# Patient Record
Sex: Female | Born: 1940 | Race: White | Hispanic: No | Marital: Married
Health system: Southern US, Community
[De-identification: ages and names within clinical notes are randomized; demographics above are authoritative.]

---

## 1998-03-18 ENCOUNTER — Emergency Department (HOSPITAL_COMMUNITY): Admission: EM | Admit: 1998-03-18 | Discharge: 1998-03-18 | Payer: Self-pay | Admitting: Emergency Medicine

## 2004-04-14 ENCOUNTER — Emergency Department (HOSPITAL_COMMUNITY): Admission: EM | Admit: 2004-04-14 | Discharge: 2004-04-14 | Payer: Self-pay | Admitting: Family Medicine

## 2004-04-22 ENCOUNTER — Emergency Department (HOSPITAL_COMMUNITY): Admission: EM | Admit: 2004-04-22 | Discharge: 2004-04-22 | Payer: Self-pay | Admitting: Family Medicine

## 2006-06-14 ENCOUNTER — Emergency Department (HOSPITAL_COMMUNITY): Admission: EM | Admit: 2006-06-14 | Discharge: 2006-06-14 | Payer: Self-pay | Admitting: Family Medicine

## 2009-04-13 ENCOUNTER — Encounter: Admission: RE | Admit: 2009-04-13 | Discharge: 2009-04-13 | Payer: Self-pay | Admitting: Internal Medicine

## 2009-07-02 ENCOUNTER — Inpatient Hospital Stay (HOSPITAL_COMMUNITY): Admission: EM | Admit: 2009-07-02 | Discharge: 2009-07-07 | Payer: Self-pay | Admitting: Emergency Medicine

## 2009-07-05 ENCOUNTER — Encounter (INDEPENDENT_AMBULATORY_CARE_PROVIDER_SITE_OTHER): Payer: Self-pay | Admitting: Internal Medicine

## 2009-07-07 ENCOUNTER — Ambulatory Visit: Payer: Self-pay | Admitting: Internal Medicine

## 2009-08-04 ENCOUNTER — Encounter: Payer: Self-pay | Admitting: Emergency Medicine

## 2009-08-18 ENCOUNTER — Encounter: Payer: Self-pay | Admitting: Emergency Medicine

## 2009-08-19 ENCOUNTER — Telehealth (INDEPENDENT_AMBULATORY_CARE_PROVIDER_SITE_OTHER): Payer: Self-pay | Admitting: *Deleted

## 2009-09-01 ENCOUNTER — Ambulatory Visit: Payer: Self-pay | Admitting: Emergency Medicine

## 2009-09-01 DIAGNOSIS — I10 Essential (primary) hypertension: Secondary | ICD-10-CM | POA: Insufficient documentation

## 2009-09-01 DIAGNOSIS — J309 Allergic rhinitis, unspecified: Secondary | ICD-10-CM | POA: Insufficient documentation

## 2009-09-01 DIAGNOSIS — C9590 Leukemia, unspecified not having achieved remission: Secondary | ICD-10-CM | POA: Insufficient documentation

## 2009-09-01 DIAGNOSIS — J438 Other emphysema: Secondary | ICD-10-CM | POA: Insufficient documentation

## 2009-09-01 DIAGNOSIS — E119 Type 2 diabetes mellitus without complications: Secondary | ICD-10-CM

## 2009-09-01 DIAGNOSIS — R930 Abnormal findings on diagnostic imaging of skull and head, not elsewhere classified: Secondary | ICD-10-CM | POA: Insufficient documentation

## 2009-10-27 ENCOUNTER — Ambulatory Visit: Payer: Self-pay | Admitting: Hematology & Oncology

## 2009-10-28 ENCOUNTER — Inpatient Hospital Stay (HOSPITAL_COMMUNITY): Admission: EM | Admit: 2009-10-28 | Discharge: 2009-11-08 | Payer: Self-pay | Admitting: Emergency Medicine

## 2009-10-28 ENCOUNTER — Ambulatory Visit: Payer: Self-pay | Admitting: Internal Medicine

## 2009-10-29 ENCOUNTER — Ambulatory Visit: Payer: Self-pay | Admitting: Internal Medicine

## 2009-10-30 ENCOUNTER — Ambulatory Visit: Payer: Self-pay | Admitting: Hematology & Oncology

## 2009-10-31 ENCOUNTER — Ambulatory Visit: Payer: Self-pay | Admitting: Internal Medicine

## 2009-11-16 ENCOUNTER — Encounter: Payer: Self-pay | Admitting: Emergency Medicine

## 2009-11-16 LAB — COMPREHENSIVE METABOLIC PANEL
ALT: 43 U/L — ABNORMAL HIGH (ref 0–35)
CO2: 25 mEq/L (ref 19–32)
Calcium: 9.6 mg/dL (ref 8.4–10.5)
Chloride: 98 mEq/L (ref 96–112)
Creatinine, Ser: 1.2 mg/dL (ref 0.40–1.20)
Sodium: 136 mEq/L (ref 135–145)
Total Protein: 7.1 g/dL (ref 6.0–8.3)

## 2009-11-16 LAB — CBC WITH DIFFERENTIAL/PLATELET
BASO%: 0 % (ref 0.0–2.0)
EOS%: 0 % (ref 0.0–7.0)
HCT: 31.8 % — ABNORMAL LOW (ref 34.8–46.6)
LYMPH%: 3.5 % — ABNORMAL LOW (ref 14.0–49.7)
MCV: 90.2 fL (ref 79.5–101.0)
MONO#: 0.9 10*3/uL (ref 0.1–0.9)
NEUT#: 5.6 10*3/uL (ref 1.5–6.5)
NEUT%: 83.2 % — ABNORMAL HIGH (ref 38.4–76.8)
Platelets: 205 10*3/uL (ref 145–400)
WBC: 6.7 10*3/uL (ref 3.9–10.3)
lymph#: 0.2 10*3/uL — ABNORMAL LOW (ref 0.9–3.3)

## 2009-11-16 LAB — LACTATE DEHYDROGENASE: LDH: 495 U/L — ABNORMAL HIGH (ref 94–250)

## 2009-12-09 ENCOUNTER — Ambulatory Visit: Payer: Self-pay | Admitting: Internal Medicine

## 2009-12-13 ENCOUNTER — Inpatient Hospital Stay (HOSPITAL_COMMUNITY): Admission: EM | Admit: 2009-12-13 | Discharge: 2009-12-15 | Payer: Self-pay | Admitting: Emergency Medicine

## 2009-12-14 ENCOUNTER — Ambulatory Visit: Payer: Self-pay | Admitting: Internal Medicine

## 2009-12-17 ENCOUNTER — Encounter (HOSPITAL_COMMUNITY)
Admission: RE | Admit: 2009-12-17 | Discharge: 2010-03-17 | Payer: Self-pay | Source: Home / Self Care | Attending: Hematology & Oncology | Admitting: Hematology & Oncology

## 2010-04-26 NOTE — Letter (Signed)
Summary: [facility]   Imported By: Sherian Rein 09/06/2009 10:01:49  _____________________________________________________________________  External Attachment:    Type:   Image     Comment:   External Document

## 2010-04-26 NOTE — Letter (Signed)
Summary: [facility]  [facility]   Imported By: Sherian Rein 12/16/2009 14:35:05  _____________________________________________________________________  External Attachment:    Type:   Image     Comment:   External Document

## 2010-04-26 NOTE — Progress Notes (Signed)
  Phone Note Other Incoming   Request: Send information Summary of Call: Records received from [facility]. Patient has an upcoming appointment with Dr. Delton Coombes on 09/01/2009 @ 11:00. 12 pages forwarded to Dr. Delton Coombes for review.

## 2010-04-26 NOTE — Assessment & Plan Note (Signed)
Summary: COPD, AML   Visit Type:  Initial Consult Copy to:  Willette Pa at [facility] Provider/Referring Provider:  Dr. Tomi Bamberger in [place]  CC:  Pulmonary consult to evaluate for SOB. The patient c/o increased sob with exertion. Recently diagnosed with leukemia. Admitted to [facility] in April 2011 and was started on continuous oxygen at that time.Marland Kitchen  History of Present Illness: 70 yo woman, former tobacco, dx with COPD in 1/11 and started on nebs. Then admitted to [facility] 4/11 for possible AE COPD, unfortunately dx with AML. Sent to [facility] to initiate therapy, chemo. D/c on Spiriva, Symbicort + O2. Of note, had nodules and a LUL spiculated lesion on CT scan 08/04/09.   Preventive Screening-Counseling & Management  Alcohol-Tobacco     Alcohol drinks/day: 0     Smoking Status: quit     Year Quit: 2011     Pack years: 35 years x1 ppd  Current Medications (verified): 1)  Prednisone 10 Mg Tabs (Prednisone) .Marland Kitchen.. 1 By Mouth Daily 2)  Albuterol Sulfate (2.5 Mg/60ml) 0.083% Nebu (Albuterol Sulfate) .Marland Kitchen.. 1 Vial in Nebulizer Four Times Daily As Needed 3)  Spiriva Handihaler 18 Mcg Caps (Tiotropium Bromide Monohydrate) .... Once Daily 4)  Symbicort 160-4.5 Mcg/act Aero (Budesonide-Formoterol Fumarate) .... 2 Puffs Two Times A Day 5)  Combivent 18-103 Mcg/act Aero (Ipratropium-Albuterol) .... 2 Puffs Two Times A Day 6)  Klonopin 0.5 Mg Tabs (Clonazepam) .Marland Kitchen.. 1 By Mouth Two Times A Day 7)  Claritin 10 Mg Tabs (Loratadine) .... Once Daily 8)  Prozac 20 Mg Caps (Fluoxetine Hcl) .... Once Daily 9)  Protonix 40 Mg Tbec (Pantoprazole Sodium) .Marland Kitchen.. 1 By Mouth Daily 10)  Hydrochlorothiazide 25 Mg Tabs (Hydrochlorothiazide) .... Once Daily 11)  Metformin Hcl 500 Mg Tabs (Metformin Hcl) .Marland Kitchen.. 1 By Mouth Two Times A Day 12)  Lantus 100 Unit/ml Soln (Insulin Glargine) .... As Directed 13)  Humulin N 100 Unit/ml Susp (Insulin Isophane Human) .... Sliding Scale 14)  Restoril 15 Mg Caps (Temazepam) .Marland Kitchen.. 1  By Mouth At Bedtime As Needed  Allergies (verified): 1)  ! * Codeine  Past History:  Past Medical History: Current Problems:  LEUKEMIA (ICD-208.90) EMPHYSEMA (ICD-492.8) ALLERGIC RHINITIS (ICD-477.9)  Past Surgical History: Cholecystectomy in 1967  Family History: Family History Emphysema ---father Family History Asthma---son  Social History: Patient states former smoker.  Housewife MarriedSmoking Status:  quit Pack years:  35 years x1 ppd Alcohol drinks/day:  0  Vital Signs:  Patient profile:   70 year old female Height:      63 inches (160.02 cm) Weight:      189 pounds (85.91 kg) BMI:     33.60 O2 Sat:      62 % on 3 L/min Temp:     97.6 degrees F (36.44 degrees C) oral Pulse rate:   131 / minute BP sitting:   114 / 78  (left arm) Cuff size:   regular  Vitals Entered By: Michel Bickers CMA (September 01, 2009 11:30 AM)  O2 Sat at Rest %:  62 O2 Flow:  3 L/min  O2 Sat Comments After resting 5 minutes the patients sats came up to 94% on 3 liters cont oxygen. Pulse was 116.Michel Bickers CMA  September 01, 2009 11:32 AM CC: Pulmonary consult to evaluate for SOB. The patient c/o increased sob with exertion. Recently diagnosed with leukemia. Admitted to [facility] in April 2011 and was started on continuous oxygen at that time. Is Patient Diabetic? Yes   Physical  Exam  General:  normal appearance, healthy appearing, on O2, and obese.   Head:  normocephalic and atraumatic Eyes:  conjunctiva and sclera clear Nose:  no deformity, discharge, inflammation, or lesions Mouth:  no deformity or lesions Lungs:  very distant, no wheezes or crackles Heart:  regular rate and rhythm, S1, S2 without murmurs, rubs, gallops, or clicks Abdomen:  not examined Extremities:  1+ edema Neurologic:  non-focal Skin:  intact without lesions or rashes Psych:  alert and cooperative; normal mood and affect; normal attention span and concentration   Impression & Recommendations:  Problem # 1:   EMPHYSEMA (ICD-492.8) - agree with current BD's - O2, need to uptitrate with exertion to 5L/min - PFTs after chemo completed Orders: Consultation Level IV (16109) DME Referral (DME)  Problem # 2:  LEUKEMIA (ICD-208.90) For 2nd chemo next week  Problem # 3:  COMPUTERIZED TOMOGRAPHY, CHEST, ABNORMAL (ICD-793.1) Need repeat CT scan in August to follow LUL lesion; may decide to do sooner while admitted by oncology, will defer to their plans.   Medications Added to Medication List This Visit: 1)  Prednisone 10 Mg Tabs (Prednisone) .Marland Kitchen.. 1 by mouth daily 2)  Albuterol Sulfate (2.5 Mg/38ml) 0.083% Nebu (Albuterol sulfate) .Marland Kitchen.. 1 vial in nebulizer four times daily as needed 3)  Spiriva Handihaler 18 Mcg Caps (Tiotropium bromide monohydrate) .... Once daily 4)  Symbicort 160-4.5 Mcg/act Aero (Budesonide-formoterol fumarate) .... 2 puffs two times a day 5)  Combivent 18-103 Mcg/act Aero (Ipratropium-albuterol) .... 2 puffs two times a day 6)  Klonopin 0.5 Mg Tabs (Clonazepam) .Marland Kitchen.. 1 by mouth two times a day 7)  Claritin 10 Mg Tabs (Loratadine) .... Once daily 8)  Prozac 20 Mg Caps (Fluoxetine hcl) .... Once daily 9)  Protonix 40 Mg Tbec (Pantoprazole sodium) .Marland Kitchen.. 1 by mouth daily 10)  Hydrochlorothiazide 25 Mg Tabs (Hydrochlorothiazide) .... Once daily 11)  Metformin Hcl 500 Mg Tabs (Metformin hcl) .Marland Kitchen.. 1 by mouth two times a day 12)  Lantus 100 Unit/ml Soln (Insulin glargine) .... As directed 13)  Humulin N 100 Unit/ml Susp (Insulin isophane human) .... Sliding scale 14)  Restoril 15 Mg Caps (Temazepam) .Marland Kitchen.. 1 by mouth at bedtime as needed  Patient Instructions: 1)  Please continue your Spiriva and Symbicort 2)  Use oxygen at all times, 2L/min at rest and up to 5L/min with exertion.  3)  You need a repeat CT scan of the chest in August 2011. Depending on your findings during your hospitalization for your AML, your oncologists may decide to repeat this study earlier.  4)  We will perform full  PFTs when your have completed your planned rounds of chemotherapy.  5)  Follow up with Dr Delton Coombes in 4 -6 weeks or as needed    Immunization History:  Influenza Immunization History:    Influenza:  historical (12/25/2008)  Pneumovax Immunization History:    Pneumovax:  historical (12/25/2008)

## 2010-05-13 ENCOUNTER — Inpatient Hospital Stay (INDEPENDENT_AMBULATORY_CARE_PROVIDER_SITE_OTHER)
Admission: RE | Admit: 2010-05-13 | Discharge: 2010-05-13 | Disposition: A | Discharge status: Home / Self Care | Payer: Medicare Other | Source: Ambulatory Visit | Attending: Family Medicine | Admitting: Family Medicine

## 2010-05-13 ENCOUNTER — Emergency Department (HOSPITAL_COMMUNITY)
Admission: EM | Admit: 2010-05-13 | Discharge: 2010-05-13 | Disposition: A | Discharge status: Home / Self Care | Payer: Medicare Other | Attending: Emergency Medicine | Admitting: Emergency Medicine

## 2010-05-13 DIAGNOSIS — R0609 Other forms of dyspnea: Secondary | ICD-10-CM | POA: Insufficient documentation

## 2010-05-13 DIAGNOSIS — J449 Chronic obstructive pulmonary disease, unspecified: Secondary | ICD-10-CM | POA: Insufficient documentation

## 2010-05-13 DIAGNOSIS — R112 Nausea with vomiting, unspecified: Secondary | ICD-10-CM | POA: Insufficient documentation

## 2010-05-13 DIAGNOSIS — R634 Abnormal weight loss: Secondary | ICD-10-CM | POA: Insufficient documentation

## 2010-05-13 DIAGNOSIS — R5381 Other malaise: Secondary | ICD-10-CM | POA: Insufficient documentation

## 2010-05-13 DIAGNOSIS — R0989 Other specified symptoms and signs involving the circulatory and respiratory systems: Secondary | ICD-10-CM | POA: Insufficient documentation

## 2010-05-13 DIAGNOSIS — J4489 Other specified chronic obstructive pulmonary disease: Secondary | ICD-10-CM | POA: Insufficient documentation

## 2010-05-13 DIAGNOSIS — E860 Dehydration: Secondary | ICD-10-CM | POA: Insufficient documentation

## 2010-05-13 DIAGNOSIS — Z79899 Other long term (current) drug therapy: Secondary | ICD-10-CM | POA: Insufficient documentation

## 2010-05-13 DIAGNOSIS — I10 Essential (primary) hypertension: Secondary | ICD-10-CM | POA: Insufficient documentation

## 2010-05-13 DIAGNOSIS — R5383 Other fatigue: Secondary | ICD-10-CM | POA: Insufficient documentation

## 2010-05-13 DIAGNOSIS — R42 Dizziness and giddiness: Secondary | ICD-10-CM | POA: Insufficient documentation

## 2010-05-13 DIAGNOSIS — R630 Anorexia: Secondary | ICD-10-CM | POA: Insufficient documentation

## 2010-05-13 DIAGNOSIS — E119 Type 2 diabetes mellitus without complications: Secondary | ICD-10-CM | POA: Insufficient documentation

## 2010-05-13 DIAGNOSIS — R197 Diarrhea, unspecified: Secondary | ICD-10-CM | POA: Insufficient documentation

## 2010-05-13 LAB — CBC
MCHC: 35.2 g/dL (ref 30.0–36.0)
Platelets: 185 10*3/uL (ref 150–400)
RDW: 13.3 % (ref 11.5–15.5)

## 2010-05-13 LAB — BASIC METABOLIC PANEL
BUN: 13 mg/dL (ref 6–23)
Chloride: 103 mEq/L (ref 96–112)
Creatinine, Ser: 1.05 mg/dL (ref 0.4–1.2)
GFR calc Af Amer: >60 mL/min (ref >60)
GFR calc non Af Amer: 52 mL/min — ABNORMAL LOW (ref >60)
Glucose, Bld: 141 mg/dL — ABNORMAL HIGH (ref 70–99)
Potassium: 3.3 mEq/L — ABNORMAL LOW (ref 3.5–5.1)

## 2010-05-13 LAB — DIFFERENTIAL
Basophils Absolute: 0 10*3/uL (ref 0.0–0.1)
Basophils Relative: 0 % (ref 0–1)
Lymphocytes Relative: 22 % (ref 12–46)
Lymphs Abs: 1.9 10*3/uL (ref 0.7–4.0)
Neutrophils Relative %: 72 % (ref 43–77)

## 2010-05-13 LAB — URINALYSIS, ROUTINE W REFLEX MICROSCOPIC
Ketones, ur: 15 mg/dL — AB
Urine Glucose, Fasting: NEGATIVE mg/dL

## 2010-05-26 ENCOUNTER — Inpatient Hospital Stay (HOSPITAL_COMMUNITY)
Admission: EM | Admit: 2010-05-26 | Discharge: 2010-06-26 | DRG: 371 | Disposition: E | Payer: Medicare Other | Attending: Internal Medicine | Admitting: Internal Medicine

## 2010-05-26 DIAGNOSIS — I82B19 Acute embolism and thrombosis of unspecified subclavian vein: Secondary | ICD-10-CM | POA: Diagnosis not present

## 2010-05-26 DIAGNOSIS — C787 Secondary malignant neoplasm of liver and intrahepatic bile duct: Secondary | ICD-10-CM | POA: Diagnosis present

## 2010-05-26 DIAGNOSIS — Z79899 Other long term (current) drug therapy: Secondary | ICD-10-CM

## 2010-05-26 DIAGNOSIS — B961 Klebsiella pneumoniae [K. pneumoniae] as the cause of diseases classified elsewhere: Secondary | ICD-10-CM | POA: Diagnosis present

## 2010-05-26 DIAGNOSIS — F05 Delirium due to known physiological condition: Secondary | ICD-10-CM | POA: Diagnosis not present

## 2010-05-26 DIAGNOSIS — N179 Acute kidney failure, unspecified: Secondary | ICD-10-CM | POA: Diagnosis not present

## 2010-05-26 DIAGNOSIS — I82A19 Acute embolism and thrombosis of unspecified axillary vein: Secondary | ICD-10-CM | POA: Diagnosis not present

## 2010-05-26 DIAGNOSIS — K219 Gastro-esophageal reflux disease without esophagitis: Secondary | ICD-10-CM | POA: Diagnosis present

## 2010-05-26 DIAGNOSIS — I498 Other specified cardiac arrhythmias: Secondary | ICD-10-CM | POA: Diagnosis not present

## 2010-05-26 DIAGNOSIS — Z66 Do not resuscitate: Secondary | ICD-10-CM | POA: Diagnosis present

## 2010-05-26 DIAGNOSIS — J449 Chronic obstructive pulmonary disease, unspecified: Secondary | ICD-10-CM | POA: Diagnosis present

## 2010-05-26 DIAGNOSIS — C9200 Acute myeloblastic leukemia, not having achieved remission: Secondary | ICD-10-CM | POA: Diagnosis present

## 2010-05-26 DIAGNOSIS — F341 Dysthymic disorder: Secondary | ICD-10-CM | POA: Diagnosis present

## 2010-05-26 DIAGNOSIS — R630 Anorexia: Secondary | ICD-10-CM | POA: Diagnosis present

## 2010-05-26 DIAGNOSIS — Z9981 Dependence on supplemental oxygen: Secondary | ICD-10-CM

## 2010-05-26 DIAGNOSIS — J4489 Other specified chronic obstructive pulmonary disease: Secondary | ICD-10-CM | POA: Diagnosis present

## 2010-05-26 DIAGNOSIS — E860 Dehydration: Secondary | ICD-10-CM | POA: Diagnosis present

## 2010-05-26 DIAGNOSIS — E119 Type 2 diabetes mellitus without complications: Secondary | ICD-10-CM | POA: Diagnosis present

## 2010-05-26 DIAGNOSIS — J984 Other disorders of lung: Secondary | ICD-10-CM | POA: Diagnosis present

## 2010-05-26 DIAGNOSIS — E46 Unspecified protein-calorie malnutrition: Secondary | ICD-10-CM | POA: Diagnosis not present

## 2010-05-26 DIAGNOSIS — R5381 Other malaise: Secondary | ICD-10-CM | POA: Diagnosis present

## 2010-05-26 DIAGNOSIS — Z803 Family history of malignant neoplasm of breast: Secondary | ICD-10-CM

## 2010-05-26 DIAGNOSIS — E876 Hypokalemia: Secondary | ICD-10-CM | POA: Diagnosis present

## 2010-05-26 DIAGNOSIS — A0472 Enterocolitis due to Clostridium difficile, not specified as recurrent: Principal | ICD-10-CM | POA: Diagnosis present

## 2010-05-26 DIAGNOSIS — I10 Essential (primary) hypertension: Secondary | ICD-10-CM | POA: Diagnosis present

## 2010-05-26 DIAGNOSIS — C3490 Malignant neoplasm of unspecified part of unspecified bronchus or lung: Secondary | ICD-10-CM | POA: Diagnosis present

## 2010-05-26 DIAGNOSIS — B9689 Other specified bacterial agents as the cause of diseases classified elsewhere: Secondary | ICD-10-CM | POA: Diagnosis present

## 2010-05-26 DIAGNOSIS — T451X5A Adverse effect of antineoplastic and immunosuppressive drugs, initial encounter: Secondary | ICD-10-CM | POA: Diagnosis not present

## 2010-05-26 DIAGNOSIS — N390 Urinary tract infection, site not specified: Secondary | ICD-10-CM | POA: Diagnosis present

## 2010-05-26 LAB — CBC
HCT: 35.1 % — ABNORMAL LOW (ref 36.0–46.0)
Hemoglobin: 12.1 g/dL (ref 12.0–15.0)
MCH: 34.5 pg — ABNORMAL HIGH (ref 26.0–34.0)
MCHC: 34.5 g/dL (ref 30.0–36.0)
MCV: 100 fL (ref 78.0–100.0)
Platelets: 178 10*3/uL (ref 150–400)
RBC: 3.51 MIL/uL — ABNORMAL LOW (ref 3.87–5.11)
RDW: 14.1 % (ref 11.5–15.5)
WBC: 8.1 10*3/uL (ref 4.0–10.5)

## 2010-05-26 LAB — URINALYSIS, ROUTINE W REFLEX MICROSCOPIC
Bilirubin Urine: NEGATIVE
Hgb urine dipstick: NEGATIVE
Ketones, ur: NEGATIVE mg/dL
Nitrite: POSITIVE — AB
Protein, ur: NEGATIVE mg/dL
Specific Gravity, Urine: 1.018 (ref 1.005–1.030)
Urine Glucose, Fasting: NEGATIVE mg/dL
Urobilinogen, UA: 0.2 mg/dL (ref 0.0–1.0)
pH: 7 (ref 5.0–8.0)

## 2010-05-26 LAB — DIFFERENTIAL
Basophils Absolute: 0 K/uL (ref 0.0–0.1)
Basophils Relative: 0 % (ref 0–1)
Eosinophils Absolute: 0 K/uL (ref 0.0–0.7)
Eosinophils Relative: 0 % (ref 0–5)
Lymphocytes Relative: 20 % (ref 12–46)
Lymphs Abs: 1.6 K/uL (ref 0.7–4.0)
Monocytes Absolute: 0.7 K/uL (ref 0.1–1.0)
Monocytes Relative: 9 % (ref 3–12)
Neutro Abs: 5.7 K/uL (ref 1.7–7.7)
Neutrophils Relative %: 71 % (ref 43–77)

## 2010-05-26 LAB — URINE MICROSCOPIC-ADD ON

## 2010-05-26 LAB — COMPREHENSIVE METABOLIC PANEL WITH GFR
ALT: 92 U/L — ABNORMAL HIGH (ref 0–35)
AST: 128 U/L — ABNORMAL HIGH (ref 0–37)
Albumin: 3 g/dL — ABNORMAL LOW (ref 3.5–5.2)
Alkaline Phosphatase: 325 U/L — ABNORMAL HIGH (ref 39–117)
BUN: 14 mg/dL (ref 6–23)
CO2: 24 meq/L (ref 19–32)
Calcium: 10.7 mg/dL — ABNORMAL HIGH (ref 8.4–10.5)
Chloride: 108 meq/L (ref 96–112)
Creatinine, Ser: 1.02 mg/dL (ref 0.4–1.2)
GFR calc non Af Amer: 54 mL/min — ABNORMAL LOW
Glucose, Bld: 92 mg/dL (ref 70–99)
Potassium: 2.7 meq/L — CL (ref 3.5–5.1)
Sodium: 139 meq/L (ref 135–145)
Total Bilirubin: 1.1 mg/dL (ref 0.3–1.2)
Total Protein: 6 g/dL (ref 6.0–8.3)

## 2010-05-26 LAB — GLUCOSE, CAPILLARY: Glucose-Capillary: 95 mg/dL (ref 70–99)

## 2010-05-26 LAB — MAGNESIUM: Magnesium: 2.2 mg/dL (ref 1.5–2.5)

## 2010-05-27 LAB — COMPREHENSIVE METABOLIC PANEL WITH GFR
ALT: 76 U/L — ABNORMAL HIGH (ref 0–35)
AST: 120 U/L — ABNORMAL HIGH (ref 0–37)
Albumin: 2.6 g/dL — ABNORMAL LOW (ref 3.5–5.2)
Alkaline Phosphatase: 303 U/L — ABNORMAL HIGH (ref 39–117)
CO2: 21 meq/L (ref 19–32)
Chloride: 115 meq/L — ABNORMAL HIGH (ref 96–112)
Potassium: 3.6 meq/L (ref 3.5–5.1)
Total Bilirubin: 0.8 mg/dL (ref 0.3–1.2)

## 2010-05-27 LAB — COMPREHENSIVE METABOLIC PANEL
BUN: 10 mg/dL (ref 6–23)
Calcium: 9.8 mg/dL (ref 8.4–10.5)
Creatinine, Ser: 1.56 mg/dL — ABNORMAL HIGH (ref 0.4–1.2)
GFR calc Af Amer: 40 mL/min — ABNORMAL LOW (ref >60)
GFR calc non Af Amer: 33 mL/min — ABNORMAL LOW (ref >60)
Glucose, Bld: 85 mg/dL (ref 70–99)
Sodium: 142 mEq/L (ref 135–145)
Total Protein: 5.5 g/dL — ABNORMAL LOW (ref 6.0–8.3)

## 2010-05-27 LAB — MAGNESIUM: Magnesium: 1.9 mg/dL (ref 1.5–2.5)

## 2010-05-27 LAB — CBC
HCT: 34.3 % — ABNORMAL LOW (ref 36.0–46.0)
Hemoglobin: 11.2 g/dL — ABNORMAL LOW (ref 12.0–15.0)
MCH: 33.4 pg (ref 26.0–34.0)
MCHC: 32.7 g/dL (ref 30.0–36.0)
MCV: 102.4 fL — ABNORMAL HIGH (ref 78.0–100.0)
Platelets: 141 10*3/uL — ABNORMAL LOW (ref 150–400)
RBC: 3.35 MIL/uL — ABNORMAL LOW (ref 3.87–5.11)
RDW: 14.5 % (ref 11.5–15.5)
WBC: 5.9 K/uL (ref 4.0–10.5)

## 2010-05-27 LAB — GLUCOSE, CAPILLARY
Glucose-Capillary: 87 mg/dL (ref 70–99)
Glucose-Capillary: 166 mg/dL — ABNORMAL HIGH (ref 70–99)

## 2010-05-27 LAB — CLOSTRIDIUM DIFFICILE BY PCR

## 2010-05-27 LAB — HEMOGLOBIN A1C
Hgb A1c MFr Bld: 6.2 % — ABNORMAL HIGH (ref <5.7)
Mean Plasma Glucose: 131 mg/dL — ABNORMAL HIGH (ref <117)

## 2010-05-27 LAB — TSH: TSH: 1.861 u[IU]/mL (ref 0.350–4.500)

## 2010-05-28 LAB — GLUCOSE, CAPILLARY
Glucose-Capillary: 123 mg/dL — ABNORMAL HIGH (ref 70–99)
Glucose-Capillary: 181 mg/dL — ABNORMAL HIGH (ref 70–99)
Glucose-Capillary: 134 mg/dL — ABNORMAL HIGH (ref 70–99)

## 2010-05-28 LAB — COMPREHENSIVE METABOLIC PANEL
Alkaline Phosphatase: 312 U/L — ABNORMAL HIGH (ref 39–117)
BUN: 7 mg/dL (ref 6–23)
CO2: 21 mEq/L (ref 19–32)
Chloride: 118 mEq/L — ABNORMAL HIGH (ref 96–112)
Creatinine, Ser: 0.81 mg/dL (ref 0.4–1.2)
GFR calc non Af Amer: >60 mL/min (ref >60)
Glucose, Bld: 132 mg/dL — ABNORMAL HIGH (ref 70–99)
Total Bilirubin: 0.7 mg/dL (ref 0.3–1.2)

## 2010-05-28 LAB — CBC
HCT: 34.5 % — ABNORMAL LOW (ref 36.0–46.0)
Hemoglobin: 11.3 g/dL — ABNORMAL LOW (ref 12.0–15.0)
MCH: 33.5 pg (ref 26.0–34.0)
MCV: 102.4 fL — ABNORMAL HIGH (ref 78.0–100.0)
Platelets: 151 10*3/uL (ref 150–400)
RBC: 3.37 MIL/uL — ABNORMAL LOW (ref 3.87–5.11)

## 2010-05-29 LAB — HEPATITIS PANEL, ACUTE
HCV Ab: NEGATIVE
Hep A IgM: NEGATIVE
Hep B C IgM: NEGATIVE
Hepatitis B Surface Ag: NEGATIVE

## 2010-05-29 LAB — URINE CULTURE
Colony Count: >=100000
Culture  Setup Time: 201203020537
Special Requests: POSITIVE

## 2010-05-29 LAB — GLUCOSE, CAPILLARY
Glucose-Capillary: 111 mg/dL — ABNORMAL HIGH (ref 70–99)
Glucose-Capillary: 192 mg/dL — ABNORMAL HIGH (ref 70–99)
Glucose-Capillary: 103 mg/dL — ABNORMAL HIGH (ref 70–99)

## 2010-05-30 LAB — GLUCOSE, CAPILLARY
Glucose-Capillary: 94 mg/dL (ref 70–99)
Glucose-Capillary: 101 mg/dL — ABNORMAL HIGH (ref 70–99)

## 2010-05-30 LAB — BASIC METABOLIC PANEL
CO2: 21 mEq/L (ref 19–32)
Calcium: 10 mg/dL (ref 8.4–10.5)
Creatinine, Ser: 0.75 mg/dL (ref 0.4–1.2)
GFR calc non Af Amer: >60 mL/min (ref >60)
Glucose, Bld: 115 mg/dL — ABNORMAL HIGH (ref 70–99)
Sodium: 140 mEq/L (ref 135–145)

## 2010-05-30 LAB — CBC
HCT: 36.9 % (ref 36.0–46.0)
Hemoglobin: 12 g/dL (ref 12.0–15.0)
MCH: 33.7 pg (ref 26.0–34.0)
MCHC: 32.5 g/dL (ref 30.0–36.0)
RDW: 15.1 % (ref 11.5–15.5)

## 2010-05-31 LAB — STOOL CULTURE

## 2010-05-31 LAB — GLUCOSE, CAPILLARY
Glucose-Capillary: 94 mg/dL (ref 70–99)
Glucose-Capillary: 92 mg/dL (ref 70–99)

## 2010-06-01 LAB — CBC
HCT: 33.9 % — ABNORMAL LOW (ref 36.0–46.0)
MCH: 33.7 pg (ref 26.0–34.0)
MCHC: 33.3 g/dL (ref 30.0–36.0)
MCV: 101.2 fL — ABNORMAL HIGH (ref 78.0–100.0)
RDW: 14.4 % (ref 11.5–15.5)

## 2010-06-01 LAB — GLUCOSE, CAPILLARY: Glucose-Capillary: 96 mg/dL (ref 70–99)

## 2010-06-01 LAB — COMPREHENSIVE METABOLIC PANEL
Alkaline Phosphatase: 317 U/L — ABNORMAL HIGH (ref 39–117)
BUN: 14 mg/dL (ref 6–23)
Calcium: 9.5 mg/dL (ref 8.4–10.5)
Glucose, Bld: 106 mg/dL — ABNORMAL HIGH (ref 70–99)
Total Protein: 5.2 g/dL — ABNORMAL LOW (ref 6.0–8.3)

## 2010-06-02 ENCOUNTER — Inpatient Hospital Stay (HOSPITAL_COMMUNITY): Payer: Medicare Other

## 2010-06-02 LAB — COMPREHENSIVE METABOLIC PANEL
ALT: 105 U/L — ABNORMAL HIGH (ref 0–35)
AST: 159 U/L — ABNORMAL HIGH (ref 0–37)
Albumin: 2.5 g/dL — ABNORMAL LOW (ref 3.5–5.2)
CO2: 23 mEq/L (ref 19–32)
Calcium: 9.1 mg/dL (ref 8.4–10.5)
GFR calc Af Amer: >60 mL/min (ref >60)
GFR calc non Af Amer: >60 mL/min (ref >60)
Sodium: 140 mEq/L (ref 135–145)
Total Protein: 5.2 g/dL — ABNORMAL LOW (ref 6.0–8.3)

## 2010-06-02 LAB — GLUCOSE, CAPILLARY: Glucose-Capillary: 105 mg/dL — ABNORMAL HIGH (ref 70–99)

## 2010-06-02 LAB — CBC
HCT: 34.7 % — ABNORMAL LOW (ref 36.0–46.0)
Hemoglobin: 11.7 g/dL — ABNORMAL LOW (ref 12.0–15.0)
MCH: 34.3 pg — ABNORMAL HIGH (ref 26.0–34.0)
MCHC: 33.7 g/dL (ref 30.0–36.0)
MCV: 101.8 fL — ABNORMAL HIGH (ref 78.0–100.0)

## 2010-06-02 NOTE — Progress Notes (Addendum)
NAME:  Grace Hayes, Grace Hayes          ACCOUNT NO.:  [id number]  MEDICAL RECORD NO.:  [id number]           PATIENT TYPE:  I  LOCATION:  1518                         FACILITY:  [facility]  PHYSICIAN:  Hillery Aldo, M.D.   DATE OF BIRTH:  12-01-1940                                PROGRESS NOTE   PRIMARY CARE PHYSICIAN: Tomi Bamberger, NP.  ONCOLOGIST: Dr. Vania Rea at [facility].  CURRENT DIAGNOSES: 1. Clostridium difficile colitis, recurrent 2. Klebsiella pneumonia/Citrobacter urinary tract infection. 3. 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 4. Tachycardia. 5. Hypertension. 6. Dehydration. 7. Acute renal failure. 8. History of chronic obstructive pulmonary disease. 9. Type 2 diabetes. 10.History of acute myeloid leukemia. 11.Anorexia. 12.Deconditioning.  DISCHARGE MEDICATIONS: Will be dictated at time of actual discharge.  CONSULTATIONS: None.  BRIEF ADMISSION HISTORY OF PRESENT ILLNESS: The patient is a 70 year old female who presented to the hospital with a chief complaint of copious diarrhea.  The patient had a past medical history of Clostridium difficile colitis in the past and had recently been on antibiotic therapy making her at high risk for recurrent Clostridium difficile infection.  She was evaluated in the emergency department and also found to be dehydrated with acute renal failure and subsequently was referred to the hospitalist service for further inpatient evaluation and treatment.  For the full details, please see the dictated report done by Dr. Orvan Falconer.  PROCEDURES AND DIAGNOSTIC STUDIES: None.  DISCHARGE LABORATORY VALUES: Will be dictated at time of actual discharge.  HOSPITAL COURSE BY PROBLEM: 1. Clostridium difficile colitis:  The patient was felt to be at high     risk for recurrent Clostridium difficile given her history of the     same in the past.  Stools were sent for C difficile  PCR which were     positive.  The patient was put on oral vancomycin, Florastor, and     ultimately Questran to help with her diarrhea stools which have     improved since admission.  She is currently on day #5 of oral     vancomycin therapy and will likely need a prolonged slow taper over     time.  Protonix has been discontinued and we recommend avoidance of     PPI therapy if possible. 2. Klebsiella pneumonia/Citrobacter urinary tract infection:  The     patient has completed 5 days of therapy with Rocephin and the     Rocephin has now been discontinued. 3. Transaminitis:  The patient has chronic transaminitis.  Viral     studies were checked and these were negative.  She had an abdominal     ultrasound done back in August which showed fatty liver     infiltration and this is most likely cause of her transaminitis. 4. Tachycardia:  The patient's tachycardia was felt to be due to     volume contraction and dehydration.  She was treated with beta-     blocker therapy for her concurrent hypertension and her heart rate     has normalized. 5. Hypertension:  The patient's blood pressure has been suboptimally     controlled  while in the hospital.  We have titrated her metoprolol     up and we will continue to monitor her for the need for additional     therapies to control her blood pressure.  The patient does not     appear to have been on any outpatient antihypertensive medications. 6. Dehydration/acute renal failure secondary to diarrhea:  The patient     was hydrated vigorously with normalization of her renal function.     She is able to maintain her hydration status off IV fluids at     present. 7. History of chronic obstructive pulmonary disease:  The patient was     continued on Spiriva and Symbicort.  She has been stable from a     respiratory standpoint. 8. Type 2 diabetes:  The patient's metformin was discontinued due to     problems of nausea.  She has been well controlled on  sliding scale     insulin. 9. History of AML:  The patient should follow up with her oncologist     post discharge. 10.Anorexia:  The patient has had persistent anorexia.  She has been     provided with Ensure supplements and at this point we are     requesting consultation with a dietitian to help with maintaining     her nutritional status. 11.Deconditioning:  The patient is markedly deconditioned and weak.     We will obtain physical and occupational therapy consults for     recommendations regarding whether or not she will be capable to     return to an assisted living facility or if she will need a higher     skilled level of care.  A discharge summary addendum will be dictated at time of actual discharge.     Hillery Aldo, M.D.     CR/MEDQ  D:  05/31/2010  T:  05/31/2010  Job:  [id number]  cc:   Tomi Bamberger, NP  Dr. Vania Rea  Electronically Signed by Hillery Aldo M.D. on 05/31/2010 81:19:14 PM Electronically Signed by Hillery Aldo M.D. on 05/31/2010 07:43:20 PM

## 2010-06-03 ENCOUNTER — Inpatient Hospital Stay (HOSPITAL_COMMUNITY): Payer: Medicare Other

## 2010-06-03 DIAGNOSIS — C9200 Acute myeloblastic leukemia, not having achieved remission: Secondary | ICD-10-CM

## 2010-06-03 LAB — HEPATIC FUNCTION PANEL
ALT: 124 U/L — ABNORMAL HIGH (ref 0–35)
Alkaline Phosphatase: 416 U/L — ABNORMAL HIGH (ref 39–117)
Indirect Bilirubin: 0.7 mg/dL (ref 0.3–0.9)
Total Bilirubin: 1.1 mg/dL (ref 0.3–1.2)

## 2010-06-03 LAB — GLUCOSE, CAPILLARY
Glucose-Capillary: 101 mg/dL — ABNORMAL HIGH (ref 70–99)
Glucose-Capillary: 171 mg/dL — ABNORMAL HIGH (ref 70–99)

## 2010-06-04 ENCOUNTER — Inpatient Hospital Stay (HOSPITAL_COMMUNITY): Payer: Medicare Other

## 2010-06-04 DIAGNOSIS — C9590 Leukemia, unspecified not having achieved remission: Secondary | ICD-10-CM

## 2010-06-04 LAB — GLUCOSE, CAPILLARY
Glucose-Capillary: 111 mg/dL — ABNORMAL HIGH (ref 70–99)
Glucose-Capillary: 109 mg/dL — ABNORMAL HIGH (ref 70–99)

## 2010-06-04 MED ORDER — IOHEXOL 300 MG/ML  SOLN
125.0000 mL | Freq: Once | INTRAMUSCULAR | Status: AC | PRN
  Administered 2010-06-04: 125 mL via INTRAVENOUS

## 2010-06-05 LAB — PROTIME-INR: INR: 1.15 (ref 0.00–1.49)

## 2010-06-05 LAB — GLUCOSE, CAPILLARY
Glucose-Capillary: 84 mg/dL (ref 70–99)
Glucose-Capillary: 80 mg/dL (ref 70–99)
Glucose-Capillary: 74 mg/dL (ref 70–99)

## 2010-06-05 LAB — CBC
HCT: 33.6 % — ABNORMAL LOW (ref 36.0–46.0)
Hemoglobin: 11.3 g/dL — ABNORMAL LOW (ref 12.0–15.0)
MCV: 101.8 fL — ABNORMAL HIGH (ref 78.0–100.0)
RBC: 3.3 MIL/uL — ABNORMAL LOW (ref 3.87–5.11)
WBC: 7.6 10*3/uL (ref 4.0–10.5)

## 2010-06-06 ENCOUNTER — Other Ambulatory Visit: Payer: Self-pay | Admitting: Interventional Radiology

## 2010-06-06 ENCOUNTER — Other Ambulatory Visit (HOSPITAL_COMMUNITY): Payer: PRIVATE HEALTH INSURANCE

## 2010-06-06 ENCOUNTER — Inpatient Hospital Stay (HOSPITAL_COMMUNITY): Payer: Medicare Other

## 2010-06-06 DIAGNOSIS — C9200 Acute myeloblastic leukemia, not having achieved remission: Secondary | ICD-10-CM

## 2010-06-06 DIAGNOSIS — C3490 Malignant neoplasm of unspecified part of unspecified bronchus or lung: Secondary | ICD-10-CM

## 2010-06-06 LAB — GLUCOSE, CAPILLARY
Glucose-Capillary: 71 mg/dL (ref 70–99)
Glucose-Capillary: 107 mg/dL — ABNORMAL HIGH (ref 70–99)

## 2010-06-06 LAB — CBC
MCH: 33.6 pg (ref 26.0–34.0)
MCHC: 33 g/dL (ref 30.0–36.0)
MCV: 101.9 fL — ABNORMAL HIGH (ref 78.0–100.0)
Platelets: 114 10*3/uL — ABNORMAL LOW (ref 150–400)
RBC: 3.24 MIL/uL — ABNORMAL LOW (ref 3.87–5.11)

## 2010-06-06 LAB — BASIC METABOLIC PANEL
BUN: 11 mg/dL (ref 6–23)
Calcium: 9.1 mg/dL (ref 8.4–10.5)
Chloride: 105 mEq/L (ref 96–112)
Creatinine, Ser: 0.96 mg/dL (ref 0.4–1.2)
GFR calc Af Amer: >60 mL/min (ref >60)

## 2010-06-07 LAB — CBC
HCT: 32.2 % — ABNORMAL LOW (ref 36.0–46.0)
Hemoglobin: 10.6 g/dL — ABNORMAL LOW (ref 12.0–15.0)
MCH: 34.1 pg — ABNORMAL HIGH (ref 26.0–34.0)
MCHC: 32.9 g/dL (ref 30.0–36.0)
MCV: 103.5 fL — ABNORMAL HIGH (ref 78.0–100.0)
Platelets: 115 K/uL — ABNORMAL LOW (ref 150–400)
RBC: 3.11 MIL/uL — ABNORMAL LOW (ref 3.87–5.11)
RDW: 14.8 % (ref 11.5–15.5)
WBC: 6.2 K/uL (ref 4.0–10.5)

## 2010-06-07 LAB — DIFFERENTIAL
Basophils Absolute: 0 K/uL (ref 0.0–0.1)
Basophils Relative: 0 % (ref 0–1)
Eosinophils Absolute: 0 K/uL (ref 0.0–0.7)
Eosinophils Relative: 0 % (ref 0–5)
Lymphocytes Relative: 15 % (ref 12–46)
Lymphs Abs: 0.9 K/uL (ref 0.7–4.0)
Monocytes Absolute: 0.4 K/uL (ref 0.1–1.0)
Monocytes Relative: 7 % (ref 3–12)
Neutro Abs: 4.8 K/uL (ref 1.7–7.7)
Neutrophils Relative %: 78 % — ABNORMAL HIGH (ref 43–77)

## 2010-06-07 LAB — GLUCOSE, CAPILLARY
Glucose-Capillary: 84 mg/dL (ref 70–99)
Glucose-Capillary: 80 mg/dL (ref 70–99)
Glucose-Capillary: 118 mg/dL — ABNORMAL HIGH (ref 70–99)

## 2010-06-07 LAB — COMPREHENSIVE METABOLIC PANEL
BUN: 12 mg/dL (ref 6–23)
CO2: 28 mEq/L (ref 19–32)
Calcium: 9.2 mg/dL (ref 8.4–10.5)
Chloride: 108 mEq/L (ref 96–112)
Creatinine, Ser: 0.82 mg/dL (ref 0.4–1.2)
GFR calc non Af Amer: >60 mL/min (ref >60)
Total Bilirubin: 1.3 mg/dL — ABNORMAL HIGH (ref 0.3–1.2)

## 2010-06-07 LAB — MAGNESIUM: Magnesium: 2.4 mg/dL (ref 1.5–2.5)

## 2010-06-08 LAB — COMPREHENSIVE METABOLIC PANEL
ALT: 127 U/L — ABNORMAL HIGH (ref 0–35)
AST: 122 U/L — ABNORMAL HIGH (ref 0–37)
Alkaline Phosphatase: 543 U/L — ABNORMAL HIGH (ref 39–117)
CO2: 29 mEq/L (ref 19–32)
Calcium: 9.8 mg/dL (ref 8.4–10.5)
GFR calc Af Amer: >60 mL/min (ref >60)
GFR calc non Af Amer: >60 mL/min (ref >60)
Potassium: 3.3 mEq/L — ABNORMAL LOW (ref 3.5–5.1)
Sodium: 144 mEq/L (ref 135–145)

## 2010-06-08 LAB — CBC
Hemoglobin: 10.8 g/dL — ABNORMAL LOW (ref 12.0–15.0)
MCHC: 32.1 g/dL (ref 30.0–36.0)
RBC: 3.23 MIL/uL — ABNORMAL LOW (ref 3.87–5.11)
WBC: 6.6 10*3/uL (ref 4.0–10.5)

## 2010-06-08 LAB — GLUCOSE, CAPILLARY
Glucose-Capillary: 97 mg/dL (ref 70–99)
Glucose-Capillary: 144 mg/dL — ABNORMAL HIGH (ref 70–99)

## 2010-06-09 LAB — COMPREHENSIVE METABOLIC PANEL
ALT: 36 U/L — ABNORMAL HIGH (ref 0–35)
AST: 28 U/L (ref 0–37)
Albumin: 2.9 g/dL — ABNORMAL LOW (ref 3.5–5.2)
Alkaline Phosphatase: 173 U/L — ABNORMAL HIGH (ref 39–117)
Alkaline Phosphatase: 188 U/L — ABNORMAL HIGH (ref 39–117)
BUN: 15 mg/dL (ref 6–23)
Chloride: 103 mEq/L (ref 96–112)
GFR calc Af Amer: >60 mL/min (ref >60)
Glucose, Bld: 103 mg/dL — ABNORMAL HIGH (ref 70–99)
Glucose, Bld: 99 mg/dL (ref 70–99)
Potassium: 3.4 mEq/L — ABNORMAL LOW (ref 3.5–5.1)
Potassium: 3.5 mEq/L (ref 3.5–5.1)
Sodium: 139 mEq/L (ref 135–145)
Total Bilirubin: 1.5 mg/dL — ABNORMAL HIGH (ref 0.3–1.2)
Total Protein: 6.6 g/dL (ref 6.0–8.3)

## 2010-06-09 LAB — BASIC METABOLIC PANEL
BUN: 16 mg/dL (ref 6–23)
Chloride: 107 mEq/L (ref 96–112)
Glucose, Bld: 124 mg/dL — ABNORMAL HIGH (ref 70–99)
Potassium: 3.9 mEq/L (ref 3.5–5.1)

## 2010-06-09 LAB — CBC
HCT: 20.8 % — ABNORMAL LOW (ref 36.0–46.0)
HCT: 24.5 % — ABNORMAL LOW (ref 36.0–46.0)
HCT: 25.1 % — ABNORMAL LOW (ref 36.0–46.0)
MCH: 32.3 pg (ref 26.0–34.0)
MCHC: 34.8 g/dL (ref 30.0–36.0)
MCHC: 35.3 g/dL (ref 30.0–36.0)
MCV: 91.4 fL (ref 78.0–100.0)
MCV: 91.7 fL (ref 78.0–100.0)
RDW: 17 % — ABNORMAL HIGH (ref 11.5–15.5)
RDW: 17.3 % — ABNORMAL HIGH (ref 11.5–15.5)
RDW: 18 % — ABNORMAL HIGH (ref 11.5–15.5)
WBC: 0.6 10*3/uL — CL (ref 4.0–10.5)

## 2010-06-09 LAB — PREPARE PLATELETS

## 2010-06-09 LAB — DIFFERENTIAL
Band Neutrophils: 0 % (ref 0–10)
Basophils Absolute: 0 10*3/uL (ref 0.0–0.1)
Basophils Relative: 0 % (ref 0–1)
Lymphs Abs: 0 10*3/uL — ABNORMAL LOW (ref 0.7–4.0)
Lymphs Abs: 0 10*3/uL — ABNORMAL LOW (ref 0.7–4.0)
Monocytes Absolute: 0 10*3/uL — ABNORMAL LOW (ref 0.1–1.0)
Neutro Abs: 0 10*3/uL — ABNORMAL LOW (ref 1.7–7.7)
Neutrophils Relative %: 0 % — ABNORMAL LOW (ref 43–77)

## 2010-06-09 LAB — CROSSMATCH: Antibody Screen: NEGATIVE

## 2010-06-09 LAB — URINALYSIS, ROUTINE W REFLEX MICROSCOPIC
Glucose, UA: NEGATIVE mg/dL
Ketones, ur: NEGATIVE mg/dL
Protein, ur: NEGATIVE mg/dL

## 2010-06-09 LAB — GLUCOSE, CAPILLARY
Glucose-Capillary: 100 mg/dL — ABNORMAL HIGH (ref 70–99)
Glucose-Capillary: 108 mg/dL — ABNORMAL HIGH (ref 70–99)
Glucose-Capillary: 113 mg/dL — ABNORMAL HIGH (ref 70–99)
Glucose-Capillary: 132 mg/dL — ABNORMAL HIGH (ref 70–99)
Glucose-Capillary: 97 mg/dL (ref 70–99)
Glucose-Capillary: 118 mg/dL — ABNORMAL HIGH (ref 70–99)

## 2010-06-09 LAB — URINE CULTURE: Colony Count: 50000

## 2010-06-09 LAB — HEMOGLOBIN A1C: Hgb A1c MFr Bld: 6.2 % — ABNORMAL HIGH (ref <5.7)

## 2010-06-10 ENCOUNTER — Inpatient Hospital Stay (HOSPITAL_COMMUNITY): Payer: Medicare Other

## 2010-06-10 LAB — GLUCOSE, CAPILLARY
Glucose-Capillary: 103 mg/dL — ABNORMAL HIGH (ref 70–99)
Glucose-Capillary: 127 mg/dL — ABNORMAL HIGH (ref 70–99)
Glucose-Capillary: 136 mg/dL — ABNORMAL HIGH (ref 70–99)
Glucose-Capillary: 137 mg/dL — ABNORMAL HIGH (ref 70–99)
Glucose-Capillary: 142 mg/dL — ABNORMAL HIGH (ref 70–99)
Glucose-Capillary: 149 mg/dL — ABNORMAL HIGH (ref 70–99)
Glucose-Capillary: 164 mg/dL — ABNORMAL HIGH (ref 70–99)
Glucose-Capillary: 180 mg/dL — ABNORMAL HIGH (ref 70–99)
Glucose-Capillary: 181 mg/dL — ABNORMAL HIGH (ref 70–99)
Glucose-Capillary: 188 mg/dL — ABNORMAL HIGH (ref 70–99)
Glucose-Capillary: 200 mg/dL — ABNORMAL HIGH (ref 70–99)
Glucose-Capillary: 205 mg/dL — ABNORMAL HIGH (ref 70–99)
Glucose-Capillary: 216 mg/dL — ABNORMAL HIGH (ref 70–99)
Glucose-Capillary: 243 mg/dL — ABNORMAL HIGH (ref 70–99)
Glucose-Capillary: 256 mg/dL — ABNORMAL HIGH (ref 70–99)
Glucose-Capillary: 276 mg/dL — ABNORMAL HIGH (ref 70–99)
Glucose-Capillary: 96 mg/dL (ref 70–99)

## 2010-06-10 LAB — PHOSPHORUS
Phosphorus: 3.5 mg/dL (ref 2.3–4.6)
Phosphorus: 3.8 mg/dL (ref 2.3–4.6)
Phosphorus: 3.8 mg/dL (ref 2.3–4.6)

## 2010-06-10 LAB — BASIC METABOLIC PANEL
BUN: 16 mg/dL (ref 6–23)
BUN: 20 mg/dL (ref 6–23)
BUN: 27 mg/dL — ABNORMAL HIGH (ref 6–23)
CO2: 25 mEq/L (ref 19–32)
CO2: 26 mEq/L (ref 19–32)
CO2: 27 mEq/L (ref 19–32)
CO2: 28 mEq/L (ref 19–32)
Calcium: 8.7 mg/dL (ref 8.4–10.5)
Calcium: 8.8 mg/dL (ref 8.4–10.5)
Calcium: 9.1 mg/dL (ref 8.4–10.5)
Chloride: 101 mEq/L (ref 96–112)
Chloride: 106 mEq/L (ref 96–112)
Chloride: 109 mEq/L (ref 96–112)
Chloride: 109 mEq/L (ref 96–112)
Chloride: 110 mEq/L (ref 96–112)
Creatinine, Ser: 0.75 mg/dL (ref 0.4–1.2)
Creatinine, Ser: 0.77 mg/dL (ref 0.4–1.2)
Creatinine, Ser: 0.79 mg/dL (ref 0.4–1.2)
Creatinine, Ser: 0.96 mg/dL (ref 0.4–1.2)
GFR calc Af Amer: >60 mL/min (ref >60)
GFR calc Af Amer: >60 mL/min (ref >60)
GFR calc Af Amer: >60 mL/min (ref >60)
GFR calc Af Amer: >60 mL/min (ref >60)
GFR calc Af Amer: >60 mL/min (ref >60)
GFR calc non Af Amer: >60 mL/min (ref >60)
GFR calc non Af Amer: >60 mL/min (ref >60)
Glucose, Bld: 104 mg/dL — ABNORMAL HIGH (ref 70–99)
Glucose, Bld: 207 mg/dL — ABNORMAL HIGH (ref 70–99)
Glucose, Bld: 211 mg/dL — ABNORMAL HIGH (ref 70–99)
Glucose, Bld: 291 mg/dL — ABNORMAL HIGH (ref 70–99)
Glucose, Bld: 91 mg/dL (ref 70–99)
Potassium: 2.5 mEq/L — CL (ref 3.5–5.1)
Potassium: 2.7 mEq/L — CL (ref 3.5–5.1)
Potassium: 2.9 mEq/L — ABNORMAL LOW (ref 3.5–5.1)
Potassium: 2.9 mEq/L — ABNORMAL LOW (ref 3.5–5.1)
Potassium: 3.5 mEq/L (ref 3.5–5.1)
Sodium: 142 mEq/L (ref 135–145)
Sodium: 143 mEq/L (ref 135–145)

## 2010-06-10 LAB — CBC
HCT: 28.7 % — ABNORMAL LOW (ref 36.0–46.0)
HCT: 34.4 % — ABNORMAL LOW (ref 36.0–46.0)
HCT: 26.8 % — ABNORMAL LOW (ref 36.0–46.0)
HCT: 27 % — ABNORMAL LOW (ref 36.0–46.0)
HCT: 27.4 % — ABNORMAL LOW (ref 36.0–46.0)
HCT: 28.3 % — ABNORMAL LOW (ref 36.0–46.0)
HCT: 31.4 % — ABNORMAL LOW (ref 36.0–46.0)
Hemoglobin: 9.9 g/dL — ABNORMAL LOW (ref 12.0–15.0)
Hemoglobin: 11.1 g/dL — ABNORMAL LOW (ref 12.0–15.0)
Hemoglobin: 10.2 g/dL — ABNORMAL LOW (ref 12.0–15.0)
Hemoglobin: 12.4 g/dL (ref 12.0–15.0)
Hemoglobin: 8.5 g/dL — ABNORMAL LOW (ref 12.0–15.0)
Hemoglobin: 9.4 g/dL — ABNORMAL LOW (ref 12.0–15.0)
Hemoglobin: 9.6 g/dL — ABNORMAL LOW (ref 12.0–15.0)
Hemoglobin: 9.8 g/dL — ABNORMAL LOW (ref 12.0–15.0)
MCH: 30.1 pg (ref 26.0–34.0)
MCH: 33.9 pg (ref 26.0–34.0)
MCH: 30.3 pg (ref 26.0–34.0)
MCH: 30.6 pg (ref 26.0–34.0)
MCH: 31.4 pg (ref 26.0–34.0)
MCH: 31.4 pg (ref 26.0–34.0)
MCH: 31.5 pg (ref 26.0–34.0)
MCH: 31.6 pg (ref 26.0–34.0)
MCHC: 34.7 g/dL (ref 30.0–36.0)
MCHC: 32.3 g/dL (ref 30.0–36.0)
MCHC: 34.7 g/dL (ref 30.0–36.0)
MCHC: 34.8 g/dL (ref 30.0–36.0)
MCHC: 35 g/dL (ref 30.0–36.0)
MCHC: 35 g/dL (ref 30.0–36.0)
MCHC: 35 g/dL (ref 30.0–36.0)
MCHC: 35.3 g/dL (ref 30.0–36.0)
MCV: 86.8 fL (ref 78.0–100.0)
MCV: 105.2 fL — ABNORMAL HIGH (ref 78.0–100.0)
MCV: 86.4 fL (ref 78.0–100.0)
MCV: 86.7 fL (ref 78.0–100.0)
MCV: 86.7 fL (ref 78.0–100.0)
MCV: 87.5 fL (ref 78.0–100.0)
MCV: 90.8 fL (ref 78.0–100.0)
Platelets: 16 10*3/uL — CL (ref 150–400)
Platelets: 23 10*3/uL — CL (ref 150–400)
Platelets: 33 10*3/uL — ABNORMAL LOW (ref 150–400)
Platelets: 38 10*3/uL — ABNORMAL LOW (ref 150–400)
Platelets: 5 10*3/uL — CL (ref 150–400)
RBC: 3.31 MIL/uL — ABNORMAL LOW (ref 3.87–5.11)
RBC: 3.27 MIL/uL — ABNORMAL LOW (ref 3.87–5.11)
RBC: 2.7 MIL/uL — ABNORMAL LOW (ref 3.87–5.11)
RBC: 3.09 MIL/uL — ABNORMAL LOW (ref 3.87–5.11)
RBC: 3.16 MIL/uL — ABNORMAL LOW (ref 3.87–5.11)
RBC: 3.23 MIL/uL — ABNORMAL LOW (ref 3.87–5.11)
RBC: 3.37 MIL/uL — ABNORMAL LOW (ref 3.87–5.11)
RBC: 3.93 MIL/uL (ref 3.87–5.11)
RDW: 14.1 % (ref 11.5–15.5)
RDW: 14.4 % (ref 11.5–15.5)
RDW: 14.6 % (ref 11.5–15.5)
RDW: 15.3 % (ref 11.5–15.5)
WBC: 0.8 10*3/uL — CL (ref 4.0–10.5)
WBC: 1.2 10*3/uL — CL (ref 4.0–10.5)
WBC: <0.4 10*3/uL — CL (ref 4.0–10.5)
WBC: <0.4 10*3/uL — CL (ref 4.0–10.5)
WBC: <0.4 10*3/uL — CL (ref 4.0–10.5)

## 2010-06-10 LAB — POTASSIUM
Potassium: 3.2 mEq/L — ABNORMAL LOW (ref 3.5–5.1)
Potassium: 3.9 mEq/L (ref 3.5–5.1)

## 2010-06-10 LAB — HEPATIC FUNCTION PANEL
AST: 44 U/L — ABNORMAL HIGH (ref 0–37)
Albumin: 2.2 g/dL — ABNORMAL LOW (ref 3.5–5.2)
Alkaline Phosphatase: 437 U/L — ABNORMAL HIGH (ref 39–117)
Bilirubin, Direct: 0.2 mg/dL (ref 0.0–0.3)
Total Bilirubin: 0.9 mg/dL (ref 0.3–1.2)

## 2010-06-10 LAB — COMPREHENSIVE METABOLIC PANEL
ALT: 47 U/L — ABNORMAL HIGH (ref 0–35)
ALT: 52 U/L — ABNORMAL HIGH (ref 0–35)
ALT: 65 U/L — ABNORMAL HIGH (ref 0–35)
ALT: 85 U/L — ABNORMAL HIGH (ref 0–35)
AST: 120 U/L — ABNORMAL HIGH (ref 0–37)
AST: 106 U/L — ABNORMAL HIGH (ref 0–37)
AST: 45 U/L — ABNORMAL HIGH (ref 0–37)
AST: 59 U/L — ABNORMAL HIGH (ref 0–37)
AST: 66 U/L — ABNORMAL HIGH (ref 0–37)
Albumin: 2.4 g/dL — ABNORMAL LOW (ref 3.5–5.2)
Albumin: 2.5 g/dL — ABNORMAL LOW (ref 3.5–5.2)
Albumin: 2.6 g/dL — ABNORMAL LOW (ref 3.5–5.2)
Alkaline Phosphatase: 289 U/L — ABNORMAL HIGH (ref 39–117)
Alkaline Phosphatase: 463 U/L — ABNORMAL HIGH (ref 39–117)
BUN: 10 mg/dL (ref 6–23)
BUN: 33 mg/dL — ABNORMAL HIGH (ref 6–23)
CO2: 26 mEq/L (ref 19–32)
CO2: 29 mEq/L (ref 19–32)
CO2: 24 mEq/L (ref 19–32)
CO2: 27 mEq/L (ref 19–32)
Calcium: 9.6 mg/dL (ref 8.4–10.5)
Calcium: 9.5 mg/dL (ref 8.4–10.5)
Calcium: 9 mg/dL (ref 8.4–10.5)
Chloride: 109 mEq/L (ref 96–112)
Chloride: 108 mEq/L (ref 96–112)
Chloride: 103 mEq/L (ref 96–112)
Chloride: 107 mEq/L (ref 96–112)
Creatinine, Ser: 0.84 mg/dL (ref 0.4–1.2)
Creatinine, Ser: 0.79 mg/dL (ref 0.4–1.2)
Creatinine, Ser: 0.72 mg/dL (ref 0.4–1.2)
Creatinine, Ser: 0.73 mg/dL (ref 0.4–1.2)
GFR calc Af Amer: >60 mL/min (ref >60)
GFR calc Af Amer: >60 mL/min (ref >60)
GFR calc Af Amer: >60 mL/min (ref >60)
GFR calc non Af Amer: >60 mL/min (ref >60)
GFR calc non Af Amer: >60 mL/min (ref >60)
GFR calc non Af Amer: 58 mL/min — ABNORMAL LOW (ref >60)
GFR calc non Af Amer: >60 mL/min (ref >60)
Glucose, Bld: 98 mg/dL (ref 70–99)
Glucose, Bld: 110 mg/dL — ABNORMAL HIGH (ref 70–99)
Glucose, Bld: 136 mg/dL — ABNORMAL HIGH (ref 70–99)
Glucose, Bld: 150 mg/dL — ABNORMAL HIGH (ref 70–99)
Glucose, Bld: 152 mg/dL — ABNORMAL HIGH (ref 70–99)
Potassium: 3.5 mEq/L (ref 3.5–5.1)
Potassium: 3.8 mEq/L (ref 3.5–5.1)
Potassium: 4.1 mEq/L (ref 3.5–5.1)
Sodium: 141 mEq/L (ref 135–145)
Sodium: 142 mEq/L (ref 135–145)
Sodium: 144 mEq/L (ref 135–145)
Sodium: 144 mEq/L (ref 135–145)
Total Bilirubin: 0.5 mg/dL (ref 0.3–1.2)
Total Bilirubin: 1.5 mg/dL — ABNORMAL HIGH (ref 0.3–1.2)
Total Bilirubin: 0.7 mg/dL (ref 0.3–1.2)
Total Bilirubin: 1 mg/dL (ref 0.3–1.2)
Total Protein: 6.7 g/dL (ref 6.0–8.3)
Total Protein: 7.1 g/dL (ref 6.0–8.3)

## 2010-06-10 LAB — DIFFERENTIAL
Band Neutrophils: 0 % (ref 0–10)
Band Neutrophils: 0 % (ref 0–10)
Basophils Absolute: 0 10*3/uL (ref 0.0–0.1)
Basophils Absolute: 0 10*3/uL (ref 0.0–0.1)
Basophils Relative: 0 % (ref 0–1)
Eosinophils Absolute: 0 10*3/uL (ref 0.0–0.7)
Eosinophils Absolute: 0 10*3/uL (ref 0.0–0.7)
Eosinophils Absolute: 0 10*3/uL (ref 0.0–0.7)
Lymphocytes Relative: 13 % (ref 12–46)
Lymphs Abs: 0.4 10*3/uL — ABNORMAL LOW (ref 0.7–4.0)
Lymphs Abs: 1.4 10*3/uL (ref 0.7–4.0)
Lymphs Abs: 0 10*3/uL — ABNORMAL LOW (ref 0.7–4.0)
Monocytes Absolute: 0.6 10*3/uL (ref 0.1–1.0)
Monocytes Absolute: 0 10*3/uL — ABNORMAL LOW (ref 0.1–1.0)
Monocytes Relative: 20 % — ABNORMAL HIGH (ref 3–12)
Monocytes Relative: 7 % (ref 3–12)
Monocytes Relative: 0 % — ABNORMAL LOW (ref 3–12)
Neutro Abs: 2 10*3/uL (ref 1.7–7.7)
Neutro Abs: 7.8 10*3/uL — ABNORMAL HIGH (ref 1.7–7.7)
Neutrophils Relative %: 79 % — ABNORMAL HIGH (ref 43–77)
nRBC: 0 /100 WBC
nRBC: 0 /100 WBC

## 2010-06-10 LAB — URINALYSIS, ROUTINE W REFLEX MICROSCOPIC
Bilirubin Urine: NEGATIVE
Ketones, ur: NEGATIVE mg/dL
Nitrite: NEGATIVE
pH: 7 (ref 5.0–8.0)

## 2010-06-10 LAB — CROSSMATCH
ABO/RH(D): A POS
Antibody Screen: NEGATIVE

## 2010-06-10 LAB — VANCOMYCIN, TROUGH
Vancomycin Tr: 26.5 ug/mL (ref 10.0–20.0)
Vancomycin Tr: 27.5 ug/mL (ref 10.0–20.0)

## 2010-06-10 LAB — CARDIAC PANEL(CRET KIN+CKTOT+MB+TROPI)
CK, MB: 0.6 ng/mL (ref 0.3–4.0)
Total CK: 84 U/L (ref 7–177)
Troponin I: 0.03 ng/mL (ref 0.00–0.06)
Troponin I: 0.03 ng/mL (ref 0.00–0.06)

## 2010-06-10 LAB — PREPARE PLATELETS

## 2010-06-10 LAB — HEMOGLOBIN AND HEMATOCRIT, BLOOD
HCT: 25 % — ABNORMAL LOW (ref 36.0–46.0)
HCT: 28.7 % — ABNORMAL LOW (ref 36.0–46.0)
HCT: 29.2 % — ABNORMAL LOW (ref 36.0–46.0)
HCT: 29.4 % — ABNORMAL LOW (ref 36.0–46.0)
HCT: 30.6 % — ABNORMAL LOW (ref 36.0–46.0)
Hemoglobin: 10.8 g/dL — ABNORMAL LOW (ref 12.0–15.0)
Hemoglobin: 7.7 g/dL — ABNORMAL LOW (ref 12.0–15.0)

## 2010-06-10 LAB — DIC (DISSEMINATED INTRAVASCULAR COAGULATION)PANEL
Fibrinogen: >800 mg/dL — ABNORMAL HIGH (ref 204–475)
INR: 1.18 (ref 0.00–1.49)
Platelets: 5 10*3/uL — CL (ref 150–400)
Smear Review: NONE SEEN
aPTT: 35 seconds (ref 24–37)

## 2010-06-10 LAB — PROTIME-INR
INR: 1.2 (ref 0.00–1.49)
Prothrombin Time: 15.4 seconds — ABNORMAL HIGH (ref 11.6–15.2)

## 2010-06-10 LAB — BLOOD GAS, ARTERIAL
Bicarbonate: 28.4 mEq/L — ABNORMAL HIGH (ref 20.0–24.0)
Drawn by: 310571
FIO2: 0.6 %
Patient temperature: 98.6
pH, Arterial: 7.491 — ABNORMAL HIGH (ref 7.350–7.400)
pO2, Arterial: 67.7 mmHg — ABNORMAL LOW (ref 80.0–100.0)

## 2010-06-10 LAB — MAGNESIUM
Magnesium: 1.7 mg/dL (ref 1.5–2.5)
Magnesium: 1.7 mg/dL (ref 1.5–2.5)
Magnesium: 1.9 mg/dL (ref 1.5–2.5)
Magnesium: 2 mg/dL (ref 1.5–2.5)
Magnesium: 2.4 mg/dL (ref 1.5–2.5)

## 2010-06-10 LAB — APTT: aPTT: 37 seconds (ref 24–37)

## 2010-06-10 LAB — URINE MICROSCOPIC-ADD ON

## 2010-06-10 LAB — CULTURE, BLOOD (ROUTINE X 2): Culture: NO GROWTH

## 2010-06-10 LAB — GLUCOSE, RANDOM: Glucose, Bld: 397 mg/dL — ABNORMAL HIGH (ref 70–99)

## 2010-06-10 LAB — PREALBUMIN: Prealbumin: 21 mg/dL (ref 18.0–45.0)

## 2010-06-11 DIAGNOSIS — C9200 Acute myeloblastic leukemia, not having achieved remission: Secondary | ICD-10-CM

## 2010-06-11 DIAGNOSIS — C3490 Malignant neoplasm of unspecified part of unspecified bronchus or lung: Secondary | ICD-10-CM

## 2010-06-11 LAB — COMPREHENSIVE METABOLIC PANEL
AST: 110 U/L — ABNORMAL HIGH (ref 0–37)
BUN: 14 mg/dL (ref 6–23)
CO2: 27 mEq/L (ref 19–32)
Calcium: 9.6 mg/dL (ref 8.4–10.5)
Creatinine, Ser: 0.85 mg/dL (ref 0.4–1.2)
GFR calc Af Amer: >60 mL/min (ref >60)
GFR calc non Af Amer: >60 mL/min (ref >60)

## 2010-06-11 LAB — GLUCOSE, CAPILLARY
Glucose-Capillary: 167 mg/dL — ABNORMAL HIGH (ref 70–99)
Glucose-Capillary: 124 mg/dL — ABNORMAL HIGH (ref 70–99)
Glucose-Capillary: 131 mg/dL — ABNORMAL HIGH (ref 70–99)

## 2010-06-11 LAB — CBC
Hemoglobin: 11.1 g/dL — ABNORMAL LOW (ref 12.0–15.0)
MCH: 34.4 pg — ABNORMAL HIGH (ref 26.0–34.0)
MCHC: 32.5 g/dL (ref 30.0–36.0)
Platelets: 133 10*3/uL — ABNORMAL LOW (ref 150–400)

## 2010-06-11 LAB — DIFFERENTIAL
Basophils Relative: 0 % (ref 0–1)
Eosinophils Absolute: 0 10*3/uL (ref 0.0–0.7)
Monocytes Absolute: 0.4 10*3/uL (ref 0.1–1.0)
Monocytes Relative: 4 % (ref 3–12)

## 2010-06-12 ENCOUNTER — Inpatient Hospital Stay (HOSPITAL_COMMUNITY): Payer: Medicare Other

## 2010-06-12 LAB — BASIC METABOLIC PANEL
BUN: 21 mg/dL (ref 6–23)
CO2: 25 mEq/L (ref 19–32)
Calcium: 9.5 mg/dL (ref 8.4–10.5)
Creatinine, Ser: 0.89 mg/dL (ref 0.4–1.2)
GFR calc Af Amer: >60 mL/min (ref >60)
Glucose, Bld: 105 mg/dL — ABNORMAL HIGH (ref 70–99)

## 2010-06-13 DIAGNOSIS — C3490 Malignant neoplasm of unspecified part of unspecified bronchus or lung: Secondary | ICD-10-CM

## 2010-06-13 LAB — COMPREHENSIVE METABOLIC PANEL
ALT: 74 U/L — ABNORMAL HIGH (ref 0–35)
AST: 128 U/L — ABNORMAL HIGH (ref 0–37)
CO2: 27 mEq/L (ref 19–32)
Calcium: 9.2 mg/dL (ref 8.4–10.5)
Chloride: 112 mEq/L (ref 96–112)
Creatinine, Ser: 0.92 mg/dL (ref 0.4–1.2)
GFR calc Af Amer: >60 mL/min (ref >60)
GFR calc non Af Amer: >60 mL/min (ref >60)
Glucose, Bld: 103 mg/dL — ABNORMAL HIGH (ref 70–99)
Sodium: 144 mEq/L (ref 135–145)
Total Bilirubin: 1.2 mg/dL (ref 0.3–1.2)

## 2010-06-13 LAB — CBC
HCT: 32.1 % — ABNORMAL LOW (ref 36.0–46.0)
Hemoglobin: 10.2 g/dL — ABNORMAL LOW (ref 12.0–15.0)
MCH: 33.6 pg (ref 26.0–34.0)
MCHC: 31.8 g/dL (ref 30.0–36.0)
RBC: 3.04 MIL/uL — ABNORMAL LOW (ref 3.87–5.11)

## 2010-06-13 LAB — GLUCOSE, CAPILLARY
Glucose-Capillary: 107 mg/dL — ABNORMAL HIGH (ref 70–99)
Glucose-Capillary: 123 mg/dL — ABNORMAL HIGH (ref 70–99)
Glucose-Capillary: 93 mg/dL (ref 70–99)
Glucose-Capillary: 119 mg/dL — ABNORMAL HIGH (ref 70–99)

## 2010-06-14 DIAGNOSIS — A0472 Enterocolitis due to Clostridium difficile, not specified as recurrent: Secondary | ICD-10-CM

## 2010-06-14 DIAGNOSIS — N390 Urinary tract infection, site not specified: Secondary | ICD-10-CM

## 2010-06-14 LAB — COMPREHENSIVE METABOLIC PANEL
BUN: 24 mg/dL — ABNORMAL HIGH (ref 6–23)
CO2: 26 mEq/L (ref 19–32)
Calcium: 8.8 mg/dL (ref 8.4–10.5)
Chloride: 113 mEq/L — ABNORMAL HIGH (ref 96–112)
GFR calc Af Amer: >60 mL/min (ref >60)
Potassium: 3.1 mEq/L — ABNORMAL LOW (ref 3.5–5.1)
Sodium: 144 mEq/L (ref 135–145)
Total Protein: 5.2 g/dL — ABNORMAL LOW (ref 6.0–8.3)

## 2010-06-14 LAB — CBC
Hemoglobin: 9.9 g/dL — ABNORMAL LOW (ref 12.0–15.0)
Platelets: 81 10*3/uL — ABNORMAL LOW (ref 150–400)
RBC: 2.93 MIL/uL — ABNORMAL LOW (ref 3.87–5.11)
WBC: 10.8 10*3/uL — ABNORMAL HIGH (ref 4.0–10.5)

## 2010-06-14 LAB — GLUCOSE, CAPILLARY
Glucose-Capillary: 92 mg/dL (ref 70–99)
Glucose-Capillary: 87 mg/dL (ref 70–99)

## 2010-06-15 DIAGNOSIS — M7989 Other specified soft tissue disorders: Secondary | ICD-10-CM

## 2010-06-15 LAB — GLUCOSE, CAPILLARY
Glucose-Capillary: 110 mg/dL — ABNORMAL HIGH (ref 70–99)
Glucose-Capillary: 114 mg/dL — ABNORMAL HIGH (ref 70–99)
Glucose-Capillary: 119 mg/dL — ABNORMAL HIGH (ref 70–99)
Glucose-Capillary: 126 mg/dL — ABNORMAL HIGH (ref 70–99)
Glucose-Capillary: 132 mg/dL — ABNORMAL HIGH (ref 70–99)
Glucose-Capillary: 139 mg/dL — ABNORMAL HIGH (ref 70–99)
Glucose-Capillary: 151 mg/dL — ABNORMAL HIGH (ref 70–99)
Glucose-Capillary: 178 mg/dL — ABNORMAL HIGH (ref 70–99)

## 2010-06-15 LAB — BASIC METABOLIC PANEL
BUN: 16 mg/dL (ref 6–23)
CO2: 29 mEq/L (ref 19–32)
CO2: 30 mEq/L (ref 19–32)
Calcium: 8.4 mg/dL (ref 8.4–10.5)
Calcium: 8.5 mg/dL (ref 8.4–10.5)
Chloride: 103 mEq/L (ref 96–112)
Creatinine, Ser: 0.96 mg/dL (ref 0.4–1.2)
Creatinine, Ser: 1.07 mg/dL (ref 0.4–1.2)
GFR calc Af Amer: >60 mL/min (ref >60)
GFR calc Af Amer: >60 mL/min (ref >60)
GFR calc Af Amer: >60 mL/min (ref >60)
GFR calc non Af Amer: 58 mL/min — ABNORMAL LOW (ref >60)
Glucose, Bld: 151 mg/dL — ABNORMAL HIGH (ref 70–99)
Glucose, Bld: 155 mg/dL — ABNORMAL HIGH (ref 70–99)
Potassium: 2.4 mEq/L — CL (ref 3.5–5.1)
Sodium: 138 mEq/L (ref 135–145)
Sodium: 140 mEq/L (ref 135–145)

## 2010-06-15 LAB — HEPATIC FUNCTION PANEL
Albumin: 2.4 g/dL — ABNORMAL LOW (ref 3.5–5.2)
Alkaline Phosphatase: 78 U/L (ref 39–117)
Indirect Bilirubin: 0.6 mg/dL (ref 0.3–0.9)
Total Bilirubin: 0.8 mg/dL (ref 0.3–1.2)
Total Protein: 7.4 g/dL (ref 6.0–8.3)

## 2010-06-15 LAB — DIFFERENTIAL
Band Neutrophils: 0 % (ref 0–10)
Band Neutrophils: 0 % (ref 0–10)
Basophils Absolute: 0 10*3/uL (ref 0.0–0.1)
Basophils Absolute: 0 10*3/uL (ref 0.0–0.1)
Basophils Absolute: 0 10*3/uL (ref 0.0–0.1)
Basophils Relative: 0 % (ref 0–1)
Basophils Relative: 0 % (ref 0–1)
Basophils Relative: 0 % (ref 0–1)
Blasts: 0 %
Eosinophils Absolute: 0 10*3/uL (ref 0.0–0.7)
Eosinophils Relative: 0 % (ref 0–5)
Lymphocytes Relative: 9 % — ABNORMAL LOW (ref 12–46)
Lymphocytes Relative: 41 % (ref 12–46)
Lymphs Abs: 2.2 10*3/uL (ref 0.7–4.0)
Lymphs Abs: 2.5 10*3/uL (ref 0.7–4.0)
Monocytes Absolute: 1.4 10*3/uL — ABNORMAL HIGH (ref 0.1–1.0)
Monocytes Relative: 0 % — ABNORMAL LOW (ref 3–12)
Monocytes Relative: 25 % — ABNORMAL HIGH (ref 3–12)
Myelocytes: 0 %
Neutro Abs: 7.7 10*3/uL (ref 1.7–7.7)
Neutro Abs: 2.1 10*3/uL (ref 1.7–7.7)
Promyelocytes Absolute: 0 %

## 2010-06-15 LAB — CROSSMATCH
ABO/RH(D): A POS
Antibody Screen: NEGATIVE

## 2010-06-15 LAB — CBC
HCT: 17.4 % — ABNORMAL LOW (ref 36.0–46.0)
HCT: 24.7 % — ABNORMAL LOW (ref 36.0–46.0)
Hemoglobin: 6.1 g/dL — CL (ref 12.0–15.0)
Hemoglobin: 9.7 g/dL — ABNORMAL LOW (ref 12.0–15.0)
MCHC: 34.4 g/dL (ref 30.0–36.0)
MCHC: 34.7 g/dL (ref 30.0–36.0)
MCHC: 34.9 g/dL (ref 30.0–36.0)
MCHC: 35.1 g/dL (ref 30.0–36.0)
MCV: 105.4 fL — ABNORMAL HIGH (ref 78.0–100.0)
MCV: 106.5 fL — ABNORMAL HIGH (ref 78.0–100.0)
MCV: 98.2 fL (ref 78.0–100.0)
Platelets: 69 10*3/uL — ABNORMAL LOW (ref 150–400)
Platelets: 39 10*3/uL — ABNORMAL LOW (ref 150–400)
Platelets: 57 10*3/uL — ABNORMAL LOW (ref 150–400)
RBC: 2.78 MIL/uL — ABNORMAL LOW (ref 3.87–5.11)
RBC: 1.64 MIL/uL — ABNORMAL LOW (ref 3.87–5.11)
RBC: 2.51 MIL/uL — ABNORMAL LOW (ref 3.87–5.11)
RBC: 2.88 MIL/uL — ABNORMAL LOW (ref 3.87–5.11)
RDW: 17.6 % — ABNORMAL HIGH (ref 11.5–15.5)
RDW: 21.7 % — ABNORMAL HIGH (ref 11.5–15.5)
RDW: 22.1 % — ABNORMAL HIGH (ref 11.5–15.5)
WBC: 8.5 10*3/uL (ref 4.0–10.5)
WBC: 5.4 10*3/uL (ref 4.0–10.5)
WBC: 6.2 10*3/uL (ref 4.0–10.5)

## 2010-06-15 LAB — RETICULOCYTES
RBC.: 1.79 MIL/uL — ABNORMAL LOW (ref 3.87–5.11)
Retic Ct Pct: 0.9 % (ref 0.4–3.1)

## 2010-06-15 LAB — PREPARE PLATELETS

## 2010-06-15 LAB — BONE MARROW EXAM

## 2010-06-15 LAB — BLOOD GAS, ARTERIAL
Bicarbonate: 25.5 mEq/L — ABNORMAL HIGH (ref 20.0–24.0)
O2 Saturation: 96.4 %
pCO2 arterial: 34.1 mmHg — ABNORMAL LOW (ref 35.0–45.0)
pO2, Arterial: 83.2 mmHg (ref 80.0–100.0)

## 2010-06-15 LAB — HEMOGLOBIN A1C
Hgb A1c MFr Bld: 6.6 % — ABNORMAL HIGH (ref 4.6–6.1)
Hgb A1c MFr Bld: 7.2 % — ABNORMAL HIGH (ref 4.6–6.1)
Mean Plasma Glucose: 143 mg/dL
Mean Plasma Glucose: 160 mg/dL

## 2010-06-15 LAB — COMPREHENSIVE METABOLIC PANEL
ALT: 73 U/L — ABNORMAL HIGH (ref 0–35)
AST: 150 U/L — ABNORMAL HIGH (ref 0–37)
Albumin: 2.4 g/dL — ABNORMAL LOW (ref 3.5–5.2)
Alkaline Phosphatase: 544 U/L — ABNORMAL HIGH (ref 39–117)
BUN: 21 mg/dL (ref 6–23)
Chloride: 111 mEq/L (ref 96–112)
Potassium: 2.8 mEq/L — ABNORMAL LOW (ref 3.5–5.1)
Sodium: 145 mEq/L (ref 135–145)
Total Bilirubin: 1.5 mg/dL — ABNORMAL HIGH (ref 0.3–1.2)

## 2010-06-15 LAB — ABO/RH: ABO/RH(D): A POS

## 2010-06-15 LAB — CHROMOSOME ANALYSIS, BONE MARROW

## 2010-06-15 LAB — HEMOCCULT GUIAC POC 1CARD (OFFICE): Fecal Occult Bld: NEGATIVE

## 2010-06-15 LAB — FOLATE: Folate: 10.7 ng/mL

## 2010-06-15 LAB — VITAMIN B12: Vitamin B-12: 1140 pg/mL — ABNORMAL HIGH (ref 211–911)

## 2010-06-16 LAB — COMPREHENSIVE METABOLIC PANEL
AST: 99 U/L — ABNORMAL HIGH (ref 0–37)
Albumin: 2.5 g/dL — ABNORMAL LOW (ref 3.5–5.2)
BUN: 19 mg/dL (ref 6–23)
Chloride: 109 mEq/L (ref 96–112)
Creatinine, Ser: 0.66 mg/dL (ref 0.4–1.2)
GFR calc Af Amer: >60 mL/min (ref >60)
Potassium: 3.2 mEq/L — ABNORMAL LOW (ref 3.5–5.1)
Total Bilirubin: 1.4 mg/dL — ABNORMAL HIGH (ref 0.3–1.2)
Total Protein: 5.5 g/dL — ABNORMAL LOW (ref 6.0–8.3)

## 2010-06-16 LAB — CBC
HCT: 26.1 % — ABNORMAL LOW (ref 36.0–46.0)
MCH: 34.3 pg — ABNORMAL HIGH (ref 26.0–34.0)
MCHC: 32.6 g/dL (ref 30.0–36.0)
MCV: 105.2 fL — ABNORMAL HIGH (ref 78.0–100.0)
Platelets: 92 10*3/uL — ABNORMAL LOW (ref 150–400)
RBC: 2.4 MIL/uL — ABNORMAL LOW (ref 3.87–5.11)
RDW: 14.6 % (ref 11.5–15.5)
RDW: 14.5 % (ref 11.5–15.5)
WBC: 3.2 10*3/uL — ABNORMAL LOW (ref 4.0–10.5)

## 2010-06-16 LAB — PREPARE PLATELETS: Unit division: 0

## 2010-06-16 LAB — DIFFERENTIAL
Basophils Absolute: 0 10*3/uL (ref 0.0–0.1)
Eosinophils Absolute: 0 10*3/uL (ref 0.0–0.7)
Lymphocytes Relative: 13 % (ref 12–46)
Monocytes Absolute: 0 10*3/uL — ABNORMAL LOW (ref 0.1–1.0)
Neutrophils Relative %: 87 % — ABNORMAL HIGH (ref 43–77)

## 2010-06-16 LAB — GLUCOSE, CAPILLARY

## 2010-06-17 LAB — CBC
MCHC: 34.1 g/dL (ref 30.0–36.0)
MCV: 91.3 fL (ref 78.0–100.0)
Platelets: 65 10*3/uL — ABNORMAL LOW (ref 150–400)
WBC: 2.4 10*3/uL — ABNORMAL LOW (ref 4.0–10.5)

## 2010-06-17 LAB — GLUCOSE, CAPILLARY
Glucose-Capillary: 95 mg/dL (ref 70–99)
Glucose-Capillary: 100 mg/dL — ABNORMAL HIGH (ref 70–99)

## 2010-06-17 LAB — CROSSMATCH
Antibody Screen: NEGATIVE
Unit division: 0

## 2010-06-17 LAB — COMPREHENSIVE METABOLIC PANEL
Albumin: 2.8 g/dL — ABNORMAL LOW (ref 3.5–5.2)
Alkaline Phosphatase: 482 U/L — ABNORMAL HIGH (ref 39–117)
BUN: 16 mg/dL (ref 6–23)
Calcium: 8.8 mg/dL (ref 8.4–10.5)
Glucose, Bld: 90 mg/dL (ref 70–99)
Potassium: 3.5 mEq/L (ref 3.5–5.1)
Total Protein: 5.8 g/dL — ABNORMAL LOW (ref 6.0–8.3)

## 2010-06-17 LAB — DIFFERENTIAL
Basophils Absolute: 0 10*3/uL (ref 0.0–0.1)
Eosinophils Absolute: 0 10*3/uL (ref 0.0–0.7)
Lymphocytes Relative: 44 % (ref 12–46)
Lymphs Abs: 1.1 10*3/uL (ref 0.7–4.0)
Monocytes Relative: 0 % — ABNORMAL LOW (ref 3–12)

## 2010-06-17 LAB — PREPARE PLATELETS: Unit division: 0

## 2010-06-18 DIAGNOSIS — C3490 Malignant neoplasm of unspecified part of unspecified bronchus or lung: Secondary | ICD-10-CM

## 2010-06-18 DIAGNOSIS — C9200 Acute myeloblastic leukemia, not having achieved remission: Secondary | ICD-10-CM

## 2010-06-18 LAB — COMPREHENSIVE METABOLIC PANEL
ALT: 67 U/L — ABNORMAL HIGH (ref 0–35)
AST: 96 U/L — ABNORMAL HIGH (ref 0–37)
Alkaline Phosphatase: 453 U/L — ABNORMAL HIGH (ref 39–117)
CO2: 27 mEq/L (ref 19–32)
Calcium: 8.4 mg/dL (ref 8.4–10.5)
Chloride: 110 mEq/L (ref 96–112)
GFR calc Af Amer: >60 mL/min (ref >60)
GFR calc non Af Amer: >60 mL/min (ref >60)
Glucose, Bld: 83 mg/dL (ref 70–99)
Potassium: 3.2 mEq/L — ABNORMAL LOW (ref 3.5–5.1)
Sodium: 144 mEq/L (ref 135–145)

## 2010-06-18 LAB — DIFFERENTIAL
Band Neutrophils: 0 % (ref 0–10)
Basophils Relative: 0 % (ref 0–1)
Eosinophils Relative: 0 % (ref 0–5)
Lymphs Abs: 0 10*3/uL — ABNORMAL LOW (ref 0.7–4.0)
Monocytes Absolute: 0 10*3/uL — ABNORMAL LOW (ref 0.1–1.0)
Neutrophils Relative %: 0 % — ABNORMAL LOW (ref 43–77)

## 2010-06-18 LAB — CBC
HCT: 32.7 % — ABNORMAL LOW (ref 36.0–46.0)
Hemoglobin: 10.7 g/dL — ABNORMAL LOW (ref 12.0–15.0)
MCHC: 32.7 g/dL (ref 30.0–36.0)
MCV: 92.1 fL (ref 78.0–100.0)
WBC: 0.8 10*3/uL — CL (ref 4.0–10.5)

## 2010-06-18 LAB — GLUCOSE, CAPILLARY
Glucose-Capillary: 76 mg/dL (ref 70–99)
Glucose-Capillary: 83 mg/dL (ref 70–99)

## 2010-06-18 LAB — PROTIME-INR: Prothrombin Time: 16 seconds — ABNORMAL HIGH (ref 11.6–15.2)

## 2010-06-19 DIAGNOSIS — I82409 Acute embolism and thrombosis of unspecified deep veins of unspecified lower extremity: Secondary | ICD-10-CM

## 2010-06-19 DIAGNOSIS — D696 Thrombocytopenia, unspecified: Secondary | ICD-10-CM

## 2010-06-19 DIAGNOSIS — C3490 Malignant neoplasm of unspecified part of unspecified bronchus or lung: Secondary | ICD-10-CM

## 2010-06-19 LAB — COMPREHENSIVE METABOLIC PANEL
AST: 108 U/L — ABNORMAL HIGH (ref 0–37)
Albumin: 2.6 g/dL — ABNORMAL LOW (ref 3.5–5.2)
Calcium: 8.4 mg/dL (ref 8.4–10.5)
Creatinine, Ser: 0.73 mg/dL (ref 0.4–1.2)
GFR calc Af Amer: >60 mL/min (ref >60)
Sodium: 143 mEq/L (ref 135–145)
Total Protein: 5.6 g/dL — ABNORMAL LOW (ref 6.0–8.3)

## 2010-06-19 LAB — CBC
Platelets: 18 10*3/uL — CL (ref 150–400)
RBC: 3.57 MIL/uL — ABNORMAL LOW (ref 3.87–5.11)
RDW: 22.1 % — ABNORMAL HIGH (ref 11.5–15.5)
WBC: 0.9 10*3/uL — CL (ref 4.0–10.5)

## 2010-06-19 LAB — PROTIME-INR: INR: 3.76 — ABNORMAL HIGH (ref 0.00–1.49)

## 2010-06-19 LAB — GLUCOSE, CAPILLARY: Glucose-Capillary: 152 mg/dL — ABNORMAL HIGH (ref 70–99)

## 2010-06-20 ENCOUNTER — Inpatient Hospital Stay (HOSPITAL_COMMUNITY): Payer: Medicare Other

## 2010-06-20 ENCOUNTER — Encounter (HOSPITAL_COMMUNITY): Payer: Self-pay

## 2010-06-20 LAB — DIFFERENTIAL
Band Neutrophils: 0 % (ref 0–10)
Eosinophils Absolute: 0 10*3/uL (ref 0.0–0.7)
Eosinophils Relative: 0 % (ref 0–5)
Monocytes Relative: 0 % — ABNORMAL LOW (ref 3–12)
nRBC: 0 /100 WBC

## 2010-06-20 LAB — PREPARE PLATELETS: Unit division: 0

## 2010-06-20 LAB — MRSA PCR SCREENING: MRSA by PCR: NEGATIVE

## 2010-06-20 LAB — CBC
HCT: 30.2 % — ABNORMAL LOW (ref 36.0–46.0)
Hemoglobin: 10 g/dL — ABNORMAL LOW (ref 12.0–15.0)
MCH: 30.1 pg (ref 26.0–34.0)
MCHC: 33.1 g/dL (ref 30.0–36.0)
MCV: 91 fL (ref 78.0–100.0)

## 2010-06-20 LAB — GLUCOSE, CAPILLARY
Glucose-Capillary: 155 mg/dL — ABNORMAL HIGH (ref 70–99)
Glucose-Capillary: 108 mg/dL — ABNORMAL HIGH (ref 70–99)

## 2010-06-20 LAB — PLATELET COUNT: Platelets: 38 10*3/uL — ABNORMAL LOW (ref 150–400)

## 2010-06-20 LAB — COMPREHENSIVE METABOLIC PANEL
BUN: 15 mg/dL (ref 6–23)
CO2: 29 mEq/L (ref 19–32)
Calcium: 8.8 mg/dL (ref 8.4–10.5)
Creatinine, Ser: 0.73 mg/dL (ref 0.4–1.2)
GFR calc non Af Amer: >60 mL/min (ref >60)
Glucose, Bld: 149 mg/dL — ABNORMAL HIGH (ref 70–99)

## 2010-06-20 LAB — BRAIN NATRIURETIC PEPTIDE: Pro B Natriuretic peptide (BNP): 916 pg/mL — ABNORMAL HIGH (ref 0.0–100.0)

## 2010-06-20 LAB — PATHOLOGIST SMEAR REVIEW

## 2010-06-20 MED ORDER — IOHEXOL 300 MG/ML  SOLN
100.0000 mL | Freq: Once | INTRAMUSCULAR | Status: AC | PRN
  Administered 2010-06-20: 100 mL via INTRAVENOUS

## 2010-06-20 NOTE — H&P (Signed)
Grace Hayes, BROOK          ACCOUNT NO.:  [id number]  MEDICAL RECORD NO.:  [id number]           PATIENT TYPE:  E  LOCATION:  WLED                         FACILITY:  [facility]  PHYSICIAN:  Vania Rea, M.D. DATE OF BIRTH:  1940/11/23  DATE OF ADMISSION:  06/08/2010 DATE OF DISCHARGE:                             HISTORY & PHYSICAL   PRIMARY CARE PROVIDER:  Tomi Bamberger, NP.  ONCOLOGIST:  Vania Rea, MD, [facility].  CHIEF COMPLAINT:  Nausea and diarrhea.  HISTORY OF PRESENT ILLNESS:  Grace Hayes is a 70 year old female with past medical history of AML, O2 dependent-COPD and type 2 diabetes, who presents to [facility] emergency department today with complaints of nausea and diarrhea.  The patient reports a several week history of loose "mucousy" stool.  The patient states the loose stools occur intermittently and has improved with Imodium. The patient also describes intractable nausea for approximately 6 weeks with little p.o. intake as well as "foul-smelling" urine for approximately 1 week.  The patient denies any recent fever, chills, chest pain, shortness of breath.  She does describe some abdominal "burning" that is intermittent in nature and has been ongoing for several weeks with associated acid reflux.  The patient finished chemotherapy with [facility] in October of 2011. The family states the patient was admitted from home to [facility] skilled nursing facility approximately 6 days ago to work on rehabilitation. Upon evaluation in the ER, the patient was found to have a potassium 2.7 with a urinary tract infection, prompting call to [facility] for admission.  PAST MEDICAL HISTORY: 1. AML with chemotherapy completed in October of 2011. 2. 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 3. Type 2 diabetes. 4. History of C. diff, per medical records at [facility]. 5. History of hypertension. 6. History of anxiety and depression. 7. Remote  cholecystectomy.  MEDICATIONS: 1. Integra Plus iron supplement 1 tablet p.o. daily. 2. Florastor 250 mg p.o. daily; 7-day course began on May 25, 2010. 3. Vitamin D2 50,000 units p.o. weekly on Wednesday. 4. Omeprazole 20 mg p.o. daily. 5. Spiriva 18 mcg inhaled daily. 6. Symbicort 160/4.5 mcg 2 puffs inhaled b.i.d. 7. Prozac 40 mg p.o. q.a.m. 8. Zofran 4 mg p.o. q.4 h p.r.n. nausea. 9. Phenergan 25 mg p.o. q.6 h p.r.n. nausea. 10.Metformin 500 mg p.o. b.i.d. 11.Claritin 10 mg p.o. daily. 12.Temazepam 7.5 mg p.o. daily at bedtime. 13.Albuterol 2 puffs inhaled q.4 h p.r.n. shortness of breath. 14.Extra Strength Tylenol 500 mg 2 tablets p.o. q.6 h p.r.n. pain.  ALLERGIES:  CODEINE.  FAMILY HISTORY:  Positive for breast cancer and diabetes in mother.  SOCIAL HISTORY:  The patient is married.  Her husband travels out of town for work.  She has a son who is also healthcare power of attorney that lives across the street.  The patient has a history of tobacco abuse.  She is a nondrinker.  REVIEW OF SYSTEMS:  As stated in HPI, otherwise negative.  PHYSICAL EXAM:  VITAL SIGNS:  Blood pressure 137/61, heart rate 90, respirations 17, temperature 97.5, O2 saturation is 100% on room air. GENERAL:  This is a  Caucasian female sitting upright in bed in no acute distress. HEAD:  Head is normocephalic, atraumatic. EYES:  Extraocular intact without scleral icterus or injection. ENT:  Mucous membranes are quite dry.  No oropharyngeal lesions are noted. NECK:  Supple with no thyromegaly, lymphadenopathy.  No JVD or carotid bruits. CHEST:  With symmetrical movement, nontender to palpation. CARDIOVASCULAR:  S1-S2, regular rate and rhythm.  No murmurs, rubs or gallops.  No lower extremity edema.  RESPIRATORY:  Lung sounds are diminished throughout but clear to auscultation bilaterally.  No wheezes, rales or crackles.  No increased work of breathing. GI:  Abdomen is soft, nontender,  nondistended with positive bowel sounds.  No appreciated masses or hepatosplenomegaly.  NEUROLOGIC:  The patient is able to move all extremities x4 without motor or sensory deficits on exam. SKIN:  Without suspicious rashes or lesions. PSYCHOLOGIC:  The patient is alert and oriented x4 with slightly flat mood and affect.  PERTINENT LABORATORY AND X-RAY DATA:  White cell count 8.1 with no shift, platelet count 178, hemoglobin 12.1, hematocrit 35.1, sodium 139, potassium 2.7, chloride 108, CO2 of 24, BUN 14, creatinine 1.02, serum glucose 92, total bilirubin 1.1, alkaline phosphatase 325, AST 128, ALT 92, albumin 3.0.  Urinalysis:  Yellow cloudy with specific gravity of 1.018, positive nitrites, small leukocytes, otherwise unremarkable.  Urine microscopic shows 7-10 WBCs and many bacteria.  ASSESSMENT/PLAN: 1. Intractable nausea.  Unclear etiology.  The patient's symptoms     could be related to medication effect as she is on metformin as     well as iron supplement.  Will hold these medications at this time     to determine if any relief.  The patient's symptoms could also be     related to acute urinary tract infection.  Will order for     supportive treatment and determine need for further workup. 2. Diarrhea.  With history of Clostridium difficile, will continue the     patient's Florastor and order for empiric p.o. vancomycin.  Will     hold p.o. Flagyl in setting of intractable nausea.  Will check     stool for Clostridium difficile as well as stool studies. 3. Hypokalemia.  Related to gastrointestinal loss.  Will replete IV     and monitor the patient's potassium level.  Will also check the     patient's magnesium to determine need for repletion. 4. Transaminitis.  This appears to be chronic in nature based on chart     review; however, it may be becoming slightly worse.  Findings could     be related to chemotherapy agent versus reactive with above     problems versus other.   Will order for IV fluid hydration given     absence of any abdominal pain.  Will hold further workup now.  Will     recheck the patient's liver function tests in the morning to     determine need for further workup. 5. Urinary tract infection.  Will order for empiric Rocephin and check     culture and sensitivity. 6. Dehydration.  Will order for IV fluids. 7. Chronic obstructive pulmonary disease, currently stable. 8. Type 2 diabetes.  Will hold the patient's metformin and order for     sliding scale insulin while inpatient. 9. Prophylaxis.  Will order for proton pump inhibitor for     gastrointestinal prophylaxis and sequential compression devices for     deep venous thrombosis for prophylaxis. 10.Code status.  The patient is  a do not resuscitate.  Both she and     family verify understanding of these orders.  DISPOSITION:  Plan is for the patient to return back to [facility] skilled nursing facility when medically stable.  The patient's son and daughter-in-law at bedside, who are also healthcare power of attorney, wish that any findings during hospitalization and/or medical decision making be discussed with them.  The patient's daughter-in-law, Jeffery Gammell, can be reached at [phone].     Cordelia Pen, NP   ______________________________ Vania Rea, M.D.    LE/MEDQ  D:  06/04/2010  T:  06/18/2010  Job:  [id number]  cc:   Tomi Bamberger, NP  Jenna Luo, MD [facility]  Electronically Signed by Cordelia Pen NP on 06/17/2010 10:54:05 AM Electronically Signed by Vania Rea M.D. on 06/20/2010 01:02:22 AM

## 2010-06-21 DIAGNOSIS — D696 Thrombocytopenia, unspecified: Secondary | ICD-10-CM

## 2010-06-21 DIAGNOSIS — J449 Chronic obstructive pulmonary disease, unspecified: Secondary | ICD-10-CM

## 2010-06-21 DIAGNOSIS — C3490 Malignant neoplasm of unspecified part of unspecified bronchus or lung: Secondary | ICD-10-CM

## 2010-06-21 DIAGNOSIS — D649 Anemia, unspecified: Secondary | ICD-10-CM

## 2010-06-21 LAB — DIFFERENTIAL
Band Neutrophils: 0 % (ref 0–10)
Basophils Relative: 0 % (ref 0–1)
Eosinophils Absolute: 0 10*3/uL (ref 0.0–0.7)
Lymphocytes Relative: 0 % — ABNORMAL LOW (ref 12–46)
Monocytes Relative: 0 % — ABNORMAL LOW (ref 3–12)
Neutrophils Relative %: 0 % — ABNORMAL LOW (ref 43–77)

## 2010-06-21 LAB — COMPREHENSIVE METABOLIC PANEL
ALT: 71 U/L — ABNORMAL HIGH (ref 0–35)
AST: 57 U/L — ABNORMAL HIGH (ref 0–37)
Albumin: 2.3 g/dL — ABNORMAL LOW (ref 3.5–5.2)
Alkaline Phosphatase: 307 U/L — ABNORMAL HIGH (ref 39–117)
CO2: 27 mEq/L (ref 19–32)
Chloride: 111 mEq/L (ref 96–112)
Creatinine, Ser: 1.01 mg/dL (ref 0.4–1.2)
GFR calc Af Amer: >60 mL/min (ref >60)
GFR calc non Af Amer: 55 mL/min — ABNORMAL LOW (ref >60)
Potassium: 3.1 mEq/L — ABNORMAL LOW (ref 3.5–5.1)
Total Bilirubin: 1.3 mg/dL — ABNORMAL HIGH (ref 0.3–1.2)

## 2010-06-21 LAB — GLUCOSE, CAPILLARY
Glucose-Capillary: 121 mg/dL — ABNORMAL HIGH (ref 70–99)
Glucose-Capillary: 101 mg/dL — ABNORMAL HIGH (ref 70–99)

## 2010-06-21 LAB — CBC
Hemoglobin: 8.9 g/dL — ABNORMAL LOW (ref 12.0–15.0)
MCH: 30.6 pg (ref 26.0–34.0)
RBC: 2.91 MIL/uL — ABNORMAL LOW (ref 3.87–5.11)
WBC: 0.4 10*3/uL — CL (ref 4.0–10.5)

## 2010-06-22 ENCOUNTER — Inpatient Hospital Stay (HOSPITAL_COMMUNITY): Payer: Medicare Other

## 2010-06-22 LAB — COMPREHENSIVE METABOLIC PANEL
ALT: 63 U/L — ABNORMAL HIGH (ref 0–35)
Alkaline Phosphatase: 213 U/L — ABNORMAL HIGH (ref 39–117)
BUN: 29 mg/dL — ABNORMAL HIGH (ref 6–23)
CO2: 25 mEq/L (ref 19–32)
Calcium: 8.6 mg/dL (ref 8.4–10.5)
GFR calc non Af Amer: 38 mL/min — ABNORMAL LOW (ref >60)
Glucose, Bld: 112 mg/dL — ABNORMAL HIGH (ref 70–99)
Sodium: 150 mEq/L — ABNORMAL HIGH (ref 135–145)
Total Protein: 5.8 g/dL — ABNORMAL LOW (ref 6.0–8.3)

## 2010-06-22 LAB — DIFFERENTIAL
Band Neutrophils: 0 % (ref 0–10)
Basophils Relative: 0 % (ref 0–1)
Eosinophils Absolute: 0 10*3/uL (ref 0.0–0.7)
Lymphs Abs: 0 10*3/uL — ABNORMAL LOW (ref 0.7–4.0)
Monocytes Absolute: 0 10*3/uL — ABNORMAL LOW (ref 0.1–1.0)
nRBC: 0 /100 WBC

## 2010-06-22 LAB — PREPARE PLATELETS: Unit division: 0

## 2010-06-22 LAB — CBC
HCT: 25 % — ABNORMAL LOW (ref 36.0–46.0)
Hemoglobin: 8 g/dL — ABNORMAL LOW (ref 12.0–15.0)
MCHC: 32 g/dL (ref 30.0–36.0)
MCV: 93.6 fL (ref 78.0–100.0)
RDW: 22.9 % — ABNORMAL HIGH (ref 11.5–15.5)

## 2010-06-22 LAB — GLUCOSE, CAPILLARY
Glucose-Capillary: 108 mg/dL — ABNORMAL HIGH (ref 70–99)
Glucose-Capillary: 126 mg/dL — ABNORMAL HIGH (ref 70–99)

## 2010-06-26 NOTE — Discharge Summary (Signed)
NAME:  Grace Hayes, Grace Hayes          ACCOUNT NO.:  [id number]  MEDICAL RECORD NO.:  [id number]           PATIENT TYPE:  LOCATION:                                 FACILITY:  PHYSICIAN:  Baltazar Najjar, MD     DATE OF BIRTH:  Nov 14, 1940  DATE OF ADMISSION: DATE OF DISCHARGE:                              DISCHARGE SUMMARY   This discharge summary reflects hospital course from March 7 to March 13.  Please refer to the dictated progress note from May 31, 2010 for more details to the hospital course prior to that day.  FINAL DISCHARGE DIAGNOSES: 1. Clostridium difficile colitis, improving on vancomycin. 2. Multiple lung nodules. 3. Hepatic metastatic lesions. 4. History of acute myelocytic leukemia. 5. Diabetes mellitus type 2. 6. History of chronic obstructive pulmonary disease. 7. Klebsiella pneumoniae/Citrobacter urinary tract infection, treated     with antibiotics. 8. Hypokalemia. 9. Dehydration/acute kidney injury, resolved. 10.Physical deconditioning.  CONSULTATION:  Oncology service.  RADIOLOGY/IMAGING: 1. Ultrasound of the abdomen done June 02, 2010 showed numerous     lesions throughout the liver consistent with hepatic metastatic     disease. 2. CT abdomen, pelvis done on June 04, 2010 showed innumerable     presumed metastatic hepatic lesions.  No intrahepatic or left     hepatic ductal dilatation.  Also showed enlarged 1.4 cm porta     hepatis lymph nodes and diffuse bilateral adrenal gland enlargement     which most likely reflect hyperplasia although underlying     metastasis could be obscured. 3. Chest CT with contrast, June 04, 2010, showed interval enlargement     of previously seen irregular left upper lobe nodule now extending     into the AP window/left suprahilar region with new multiple     bilateral pulmonary nodules, likely metastasis.  Also showed new     small bilateral pleural effusion and sclerosis in the C5 vertebral     body concerning for  metastasis.  PROCEDURES:  Liver biopsy, June 06, 2010.  BRIEF ADMITTING HISTORY:  Please refer to progress note dated May 31, 2010 for admitting history.  Basically on summary, Ms. Grace Hayes is a 70 year old woman admitted for copious diarrhea, found to be dehydrated and acute kidney injury.  Her stool for Clostridium difficile PCR was positive. 1. Clostridium difficile colitis.  The patient was treated with     vancomycin and her diarrhea is currently improving.  She will need     at least 14 days of Vancocin which was started on March 1 to end on     March 15. 2. Hepatic metastatic lesion, unknown primary.  At this time, she had     liver biopsy done yesterday, possible primary could be lung cancer.     The patient is followed closely by Oncology service. 3. Pulmonary nodules/left upper lobe nodule, possibly the primary.  As     per Dr. Arbutus Ped note from today, she might need an MRI if the bath     is consistent with lung cancer.  She might need an MRI brain if     bath consistent with lung cancer. 4. History  of AML, status post chemotherapy, was treated at [facility] 5. Hypertension, controlled. 6. Diabetes mellitus type 2, fairly controlled. 7. History of UTI, treated with antibiotic. 8. Goals of care, based on the bath report, which is expected to have     a preliminary by tomorrow.  Family meeting is scheduled for     Thursday to determine the goals of care.  Noted that the patient     was already on hospice care attached in place.  DISCHARGE MEDICATIONS:  To be determined at the time of discharge.  DISPOSITION:  Based on family/Oncology meeting on Monday and bath report to determine final disposition.  CODE STATUS:  The patient is DNR.          ______________________________ Baltazar Najjar, MD     SA/MEDQ  D:  06/07/2010  T:  06/07/2010  Job:  [id number]  Electronically Signed by Hannah Beat MD on 06/26/2010 08:51:52 PM

## 2010-06-26 NOTE — Consult Note (Signed)
NAME:  Grace Hayes, Grace Hayes          ACCOUNT NO.:  [id number]  MEDICAL RECORD NO.:  [id number]           PATIENT TYPE:  I  LOCATION:  1309                         FACILITY:  [facility]  PHYSICIAN:  Acey Lav, MD  DATE OF BIRTH:  November 19, 1940  DATE OF CONSULTATION: DATE OF DISCHARGE:                                CONSULTATION   REQUESTING PHYSICIAN:  Mohamed K. Arbutus Ped, MD  REASON FOR INFECTIOUS DISEASE CONSULTATION:  Patient with Clostridium difficile colitis.  HISTORY OF PRESENT ILLNESS:  Grace Hayes is a 70 year old Caucasian female with a past medical history significant for AML, COPD, now recently diagnosed with small cell lung cancer, and also history of C diff colitis, treated at [facility], not clear when this was though.  In any case, she presented to the hospital on March 1 with nausea, vomiting, and profuse diarrhea.  On admission, she had a urine culture done, although I see no urinalysis.  She also was found to be C diff positive by PCR testing.  She was treated for both C diff colitis and concomitantly for a urinary tract infection (this is something I tried to avoid doing and mainly I tried to avoid diagnosing other infections, when I have diagnosed C diff colitis because of treating possible bladder infections and it complicated the ability to clear C diff.  In any case, the patient was treated with cefazolin through March 5 and was treated with vancomycin 125 four times daily through March 15.  Her diarrhea has resolved and she feels better.  She remains on contact precautions and this seems to be one of the concerns whether or not the patient needed to remain on contact precautions and the answers she certainly does need to remain in contact precautions with enteric precautions that she is.  PAST MEDICAL HISTORY: 1. History in the past of C diff colitis at [facility], she says this is     years ago although currently the patient is not fully oriented  and     not very good in giving me a clear-cut history. 2. AML chemotherapy completed October 2011. 3. Recently diagnosed small cell lung cancer status post biopsy on     March 12. 4. Diabetes mellitus. 5. Hypertension. 6. Anxiety and depression. 7. Past surgical history of cholecystectomy.  FAMILY HISTORY:  Positive for breast cancer, diabetes.  SOCIAL HISTORY:  The patient is married.  Her husband travels out of town for work.  She has a son who is her healthcare power of attorney. Prior history of tobacco, nondrinker.  REVIEW OF SYSTEMS:  Described in history of present illness, otherwise difficult to obtain clearly and the patient is otherwise negative on 12 point review.  ALLERGIES:  CODEINE.  CURRENT MEDICATIONS:  Allopurinol, Symbicort, cholestyramine, filgrastim, fluoxetine, loratadine, lorazepam, metoprolol, Zofran, tiotropium, Tylenol, albuterol, Dilaudid, Ultram.  PHYSICAL EXAMINATION:  VITAL SIGNS:  Temperature maximum overnight 99.5, temperature current 99.5; blood pressure 151/79, pulse 103, respirations 21, pulse ox 90% to 2 L via nasal cannula. GENERAL:  Quite pleasant lady in no acute distress. HEENT:  Normocephalic, atraumatic.  Pupils equal and reactive to light. Sclerae icteric.  Oropharynx clear. NECK:  Supple. CARDIOVASCULAR:  Regular rate and rhythm.  No murmurs, gallops, or rubs. LUNGS:  Clear to auscultation bilaterally. ABDOMEN:  Soft, nondistended, nontender, positive bowel sounds. EXTREMITIES:  No edema.  LABORATORY DATA:  Comprehensive metabolic panel, sodium 144, potassium 3.1, chloride 113, bicarb 26, BUN and creatinine 24 and 0.91, bilirubin 1.2, alk phos 597, ALT and AST 155 and 78.  CBC with differential, white count 10.8, hemoglobin 9.9, platelets 81.  Microbiological data, C diff by PCR on the 2nd was positive.  Cultures from stool was negative on the 2nd.  Urine culture from the 1st grew 100,000 colony forming units of Klebsiella  pneumonia, this was resistant to ampicillin but sensitive to all other organisms.  Also grew Citrobacter amalonaticus which was resistant to cefazolin and sensitive to ceftriaxone, ciprofloxacin, gentamicin, Macrobid, intermediate tobramycin and Bactrim sensitive.  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 1. C diff colitis:  I actually questioned the contaminant diagnosis of     urinary tract infection.  Note, from the admission history and     physical, the patient did have some foul color to her urine and     certainly this could have correlate the urinary tract infection.     Her culture is mixed with 2 organisms and I would note that 1 of     the 2 organisms were not sensitive to the antibiotic that she was     given to treat urinary tract infection, namely the Citrobacter was     resistant to cefazolin.  In fact she improved with antibiotics     again to me will argue that she did not really have urinary tract     infection with this pathogen.  In any case, I certainly do think     she had C diff colitis and this is a second occurrence.     Recommended treatment duration is 10-14 days given that she was     given documented antibiotics which would make it harder for her to     clear her C diff colitis.  I would have wanted to treat her for 10-     14 days beyond completion of her Ancef.  Looks like she did get     vancomycin from the 5th through the 15th and she did finish 10 days     and probably would have pushed it to 14 days, but she seems doing     well after stopping the vancomycin.  I would avoid all unnecessary     antibiotics in this patient and if possible we will use the narrow     as possible antibiotics  in the shortest possible courses.  Also, to     avoid proton pump inhibitors too.  She will be at risk for     relapsing again with a C diff colitis.  If this happens again, she     will need a pulse and taper course of vancomycin.  One could also     contemplate the dexamycin, although it cause nearly 100 fold more     than vancomycin.  There is some evidence of risk of recurrence with     it. 2. Urinary tract infection.  I am skeptical this diagnosis and  mentioned in note that 1 of the 2 pathogens isolated was not     sensitive to the antibiotic that was used.  Thank you for Infectious Disease consultation.     Acey Lav, MD     CV/MEDQ  D:  06/14/2010  T:  06/14/2010  Job:  [id number]  Electronically Signed by Paulette Blanch DAM MD on 07-02-10 12:32:57 PM

## 2010-06-26 DEATH — deceased

## 2011-08-08 IMAGING — CT CT BIOPSY
1 of 7 series · 11 of 32 positions shown, 17 images · non-contrast
Comparison: none

CLINICAL DATA: Anemia, thrombocytopenia

[Series 2: rtn ap without · axial · non-contrast · 0.57mm/px · z∈[+722,+817]mm · 11 of 23 slices shown, 17 images]
[im 2/23  soft-tissue]
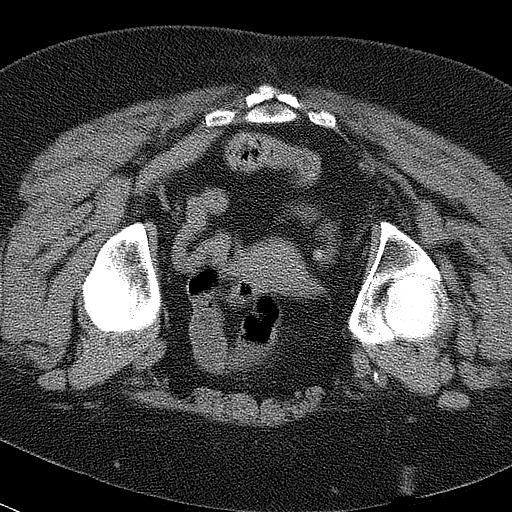
[im 2/23  bone]
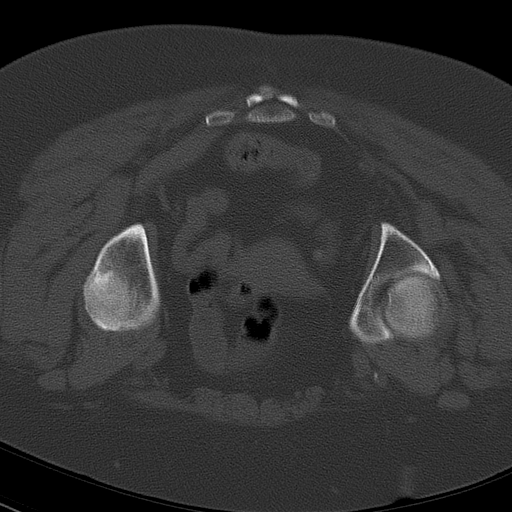
[im 4/23  soft-tissue]
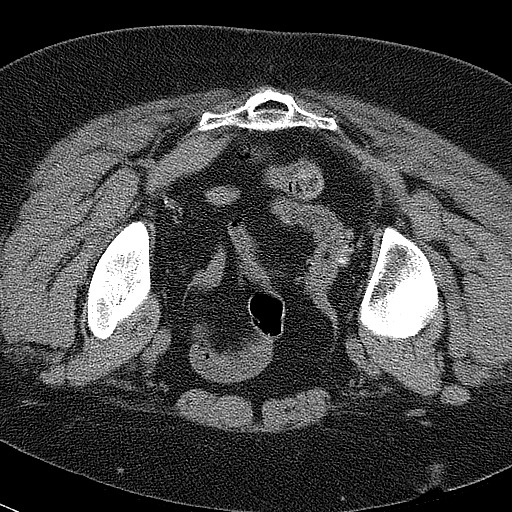
[im 6/23  soft-tissue]
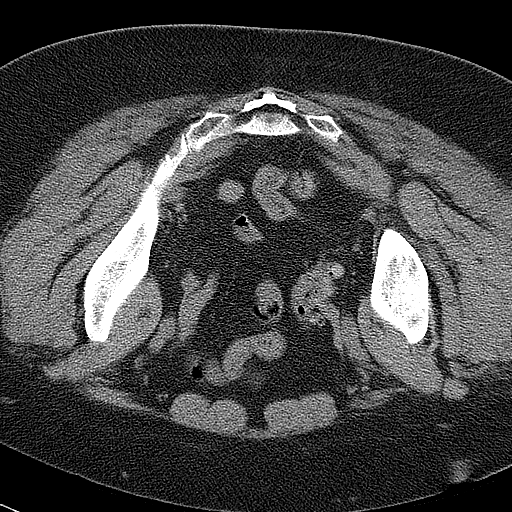
[im 8/23  soft-tissue]
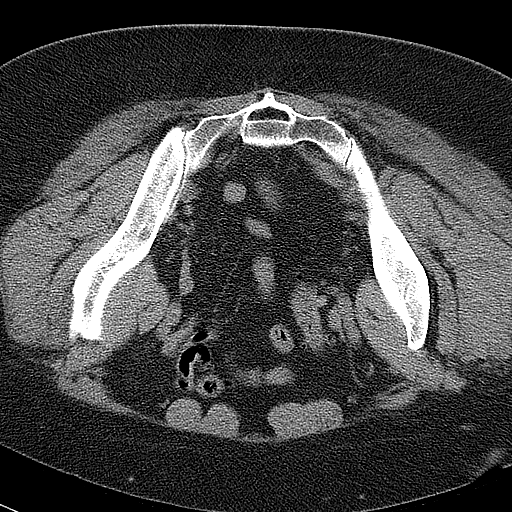
[im 10/23  soft-tissue]
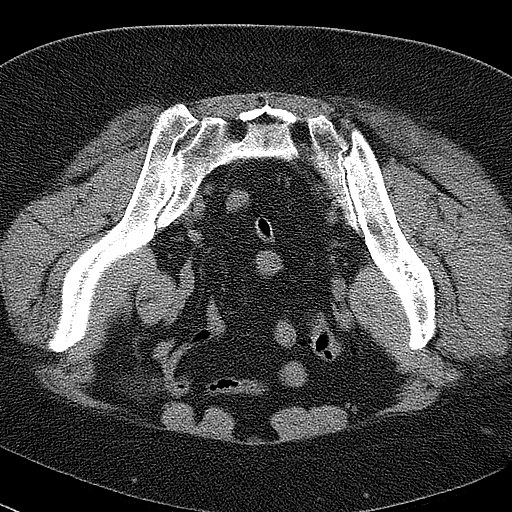
[im 12/23  soft-tissue]
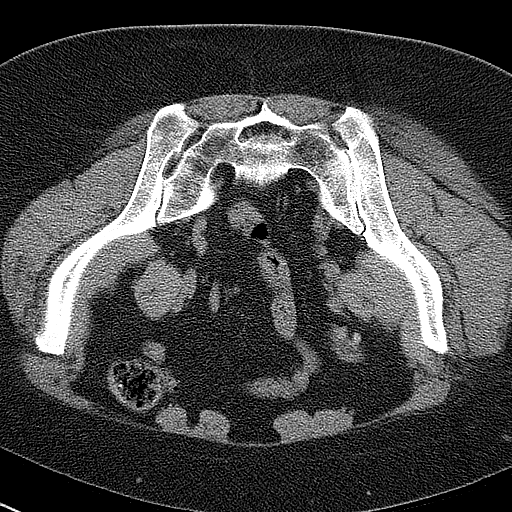
[im 13/23  soft-tissue]
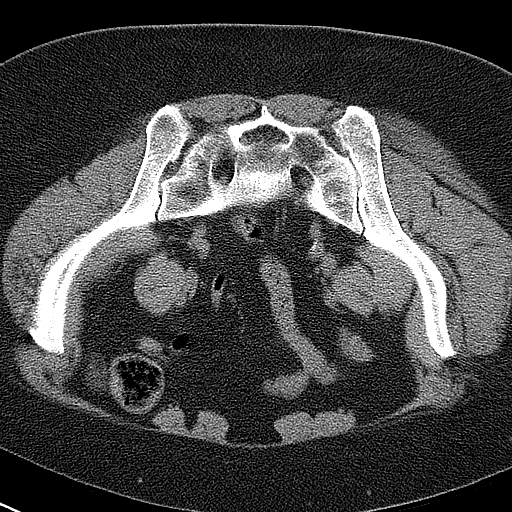
[im 15/23  soft-tissue]
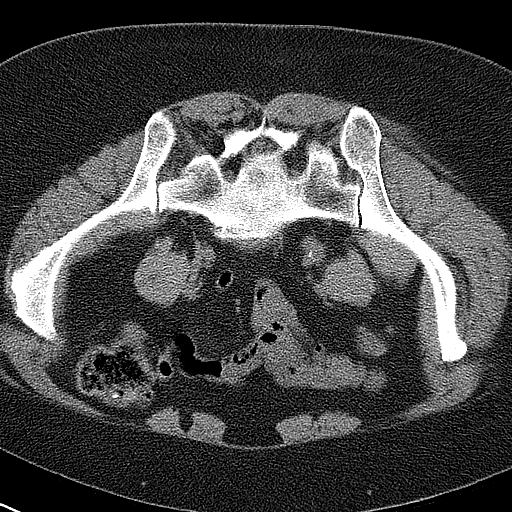
[im 15/23  lung]
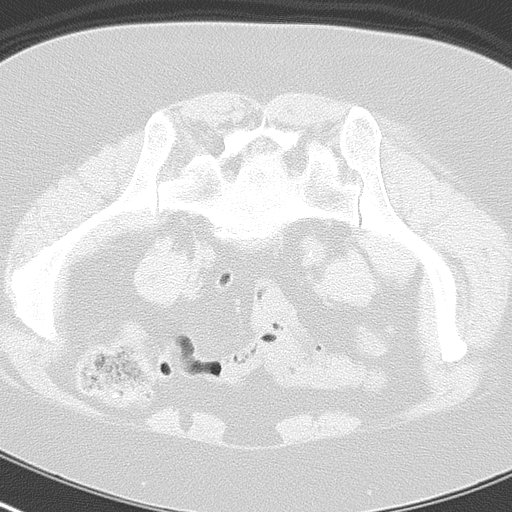
[im 17/23  soft-tissue]
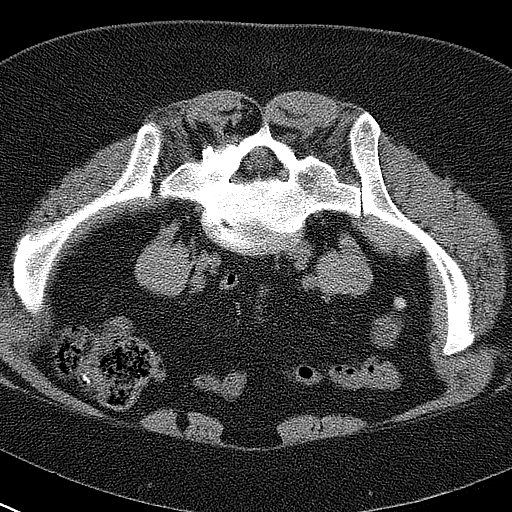
[im 17/23  lung]
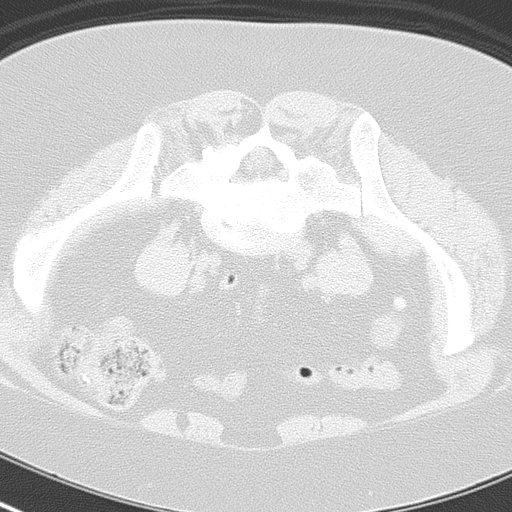
[im 17/23  bone]
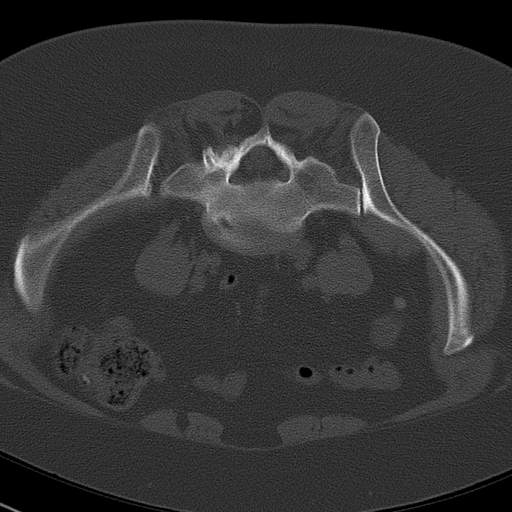
[im 19/23  soft-tissue]
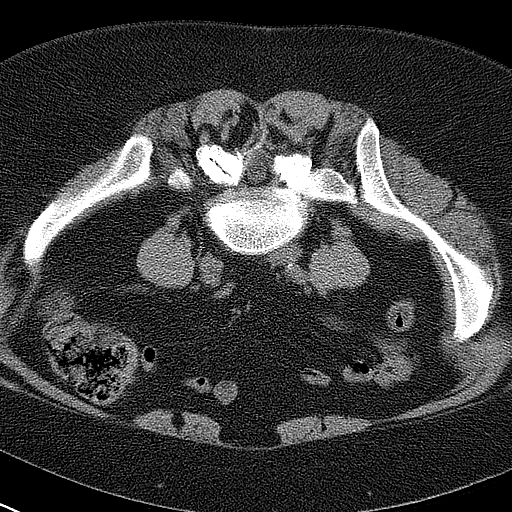
[im 19/23  lung]
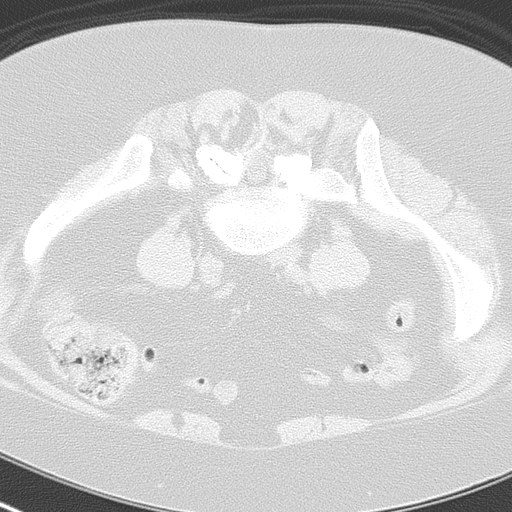
[im 21/23  soft-tissue]
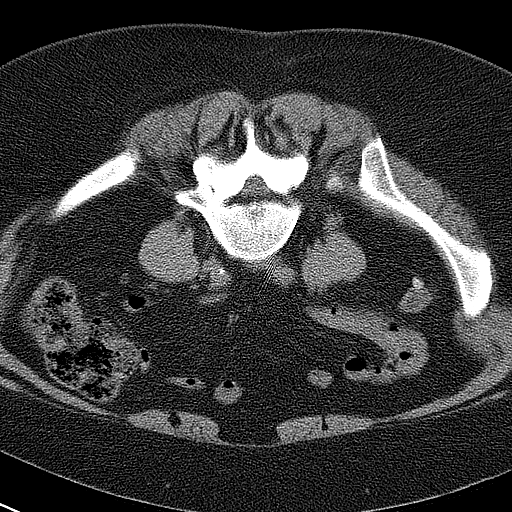
[im 21/23  lung]
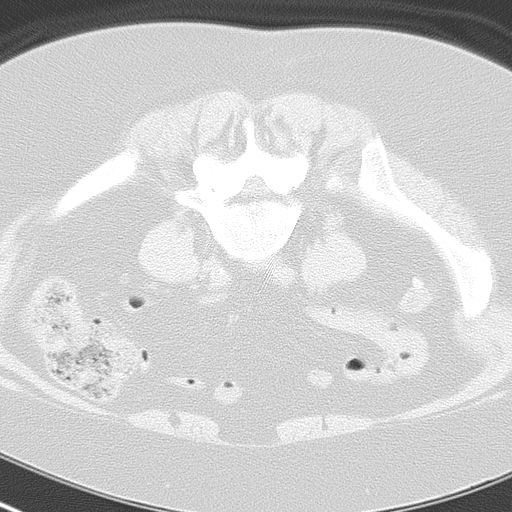

[11 of 32 positions shown; findings below may reference images not displayed]

CT GUIDED RIGHT ILIAC BONE MARROW ASPIRATION AND CORE BIOPSY

Date:  07/05/2009 [DATE]

Radiologist:  Shalley Jim, M.D.

Medications:  2 mg Versed, 100 mcg Fentanyl

Guidance:  CT

Sedation time:  15 minutes

Contrast volume:  None.

Complications:  No immediate

PROCEDURE/FINDINGS:

Informed consent was obtained from the patient following
explanation of the procedure, risks, benefits and alternatives.
The patient understands, agrees and consents for the procedure.
All questions were addressed.  A time out was performed.

The patient was positioned prone and noncontrast localization CT
was performed of the pelvis to demonstrate the iliac marrow spaces.

Maximal barrier sterile technique utilized including caps, mask,
sterile gowns, sterile gloves, large sterile drape, hand hygiene,
and betadine prep.

Under sterile conditions and local anesthesia, an 11 gauge coaxial
bone biopsy needle was advanced into the right iliac marrow space.
Needle position was confirmed with CT imaging. Initially, bone
marrow aspiration was performed. Next, the 11 gauge outer cannula
was utilized to obtain a right iliac bone marrow core biopsy.
Needle was removed. Hemostasis was obtained with compression. The
patient tolerated the procedure well. Samples were prepared with
the cytotechnologist. No immediate complications.
IMPRESSION: CT guided right iliac bone marrow aspiration and core biopsy.

## 2011-12-04 IMAGING — US US ABDOMEN PORT
1 series · 13 of 25 positions shown · non-contrast
Comparison: None

CLINICAL DATA: Abnormal LFTs.  Neutropenia.  History of
cholecystectomy.

ABDOMINAL ULTRASOUND COMPLETE

[Series 1: us abdomen port · 0.31mm/px · 13 of 49 slices shown]
[im 1/49]
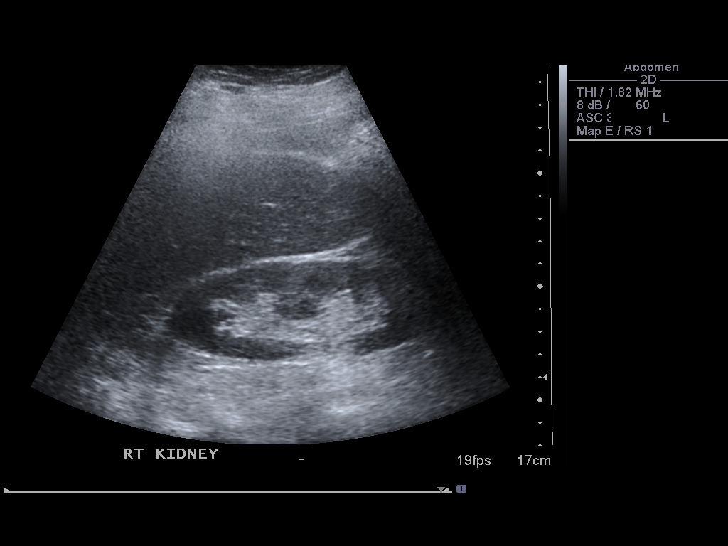
[im 5/49]
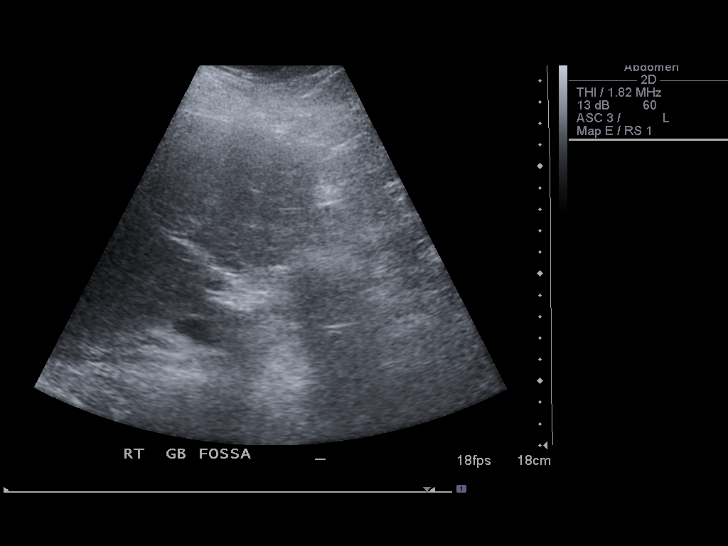
[im 9/49]
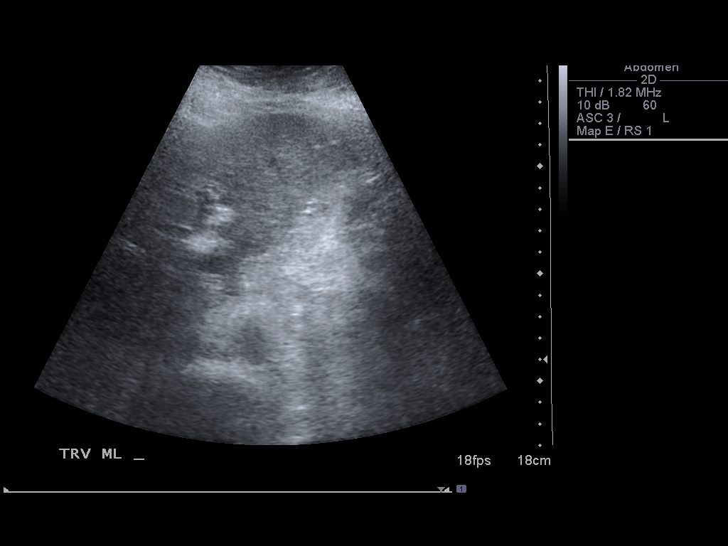
[im 13/49]
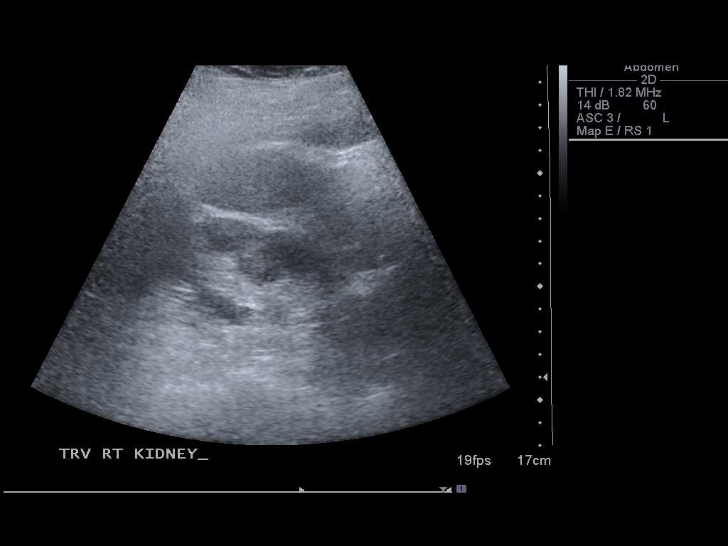
[im 17/49]
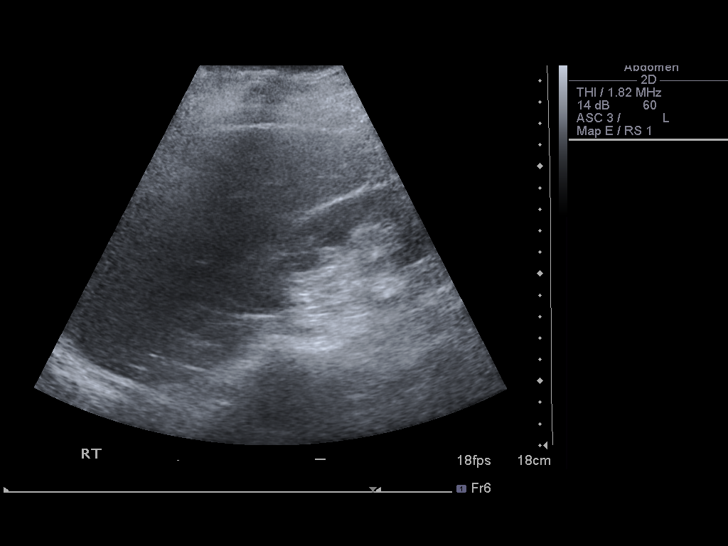
[im 21/49]
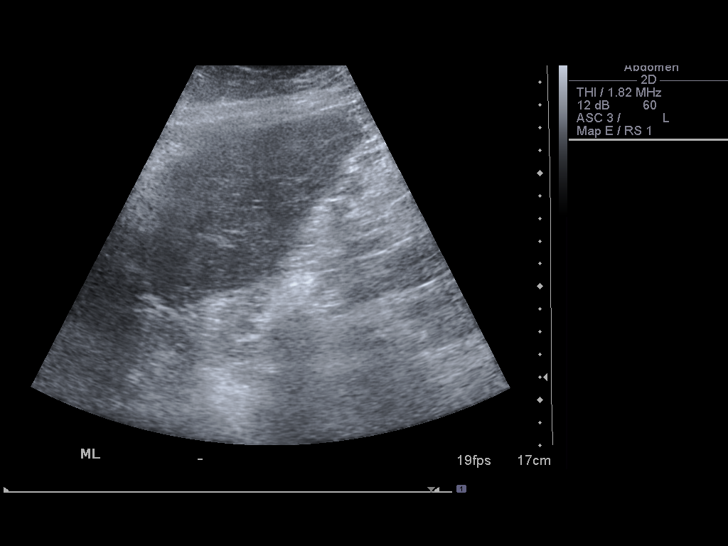
[im 25/49]
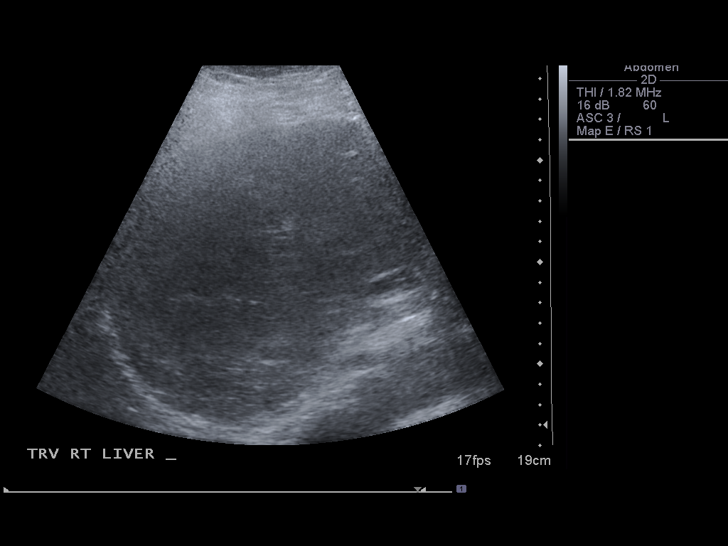
[im 29/49]
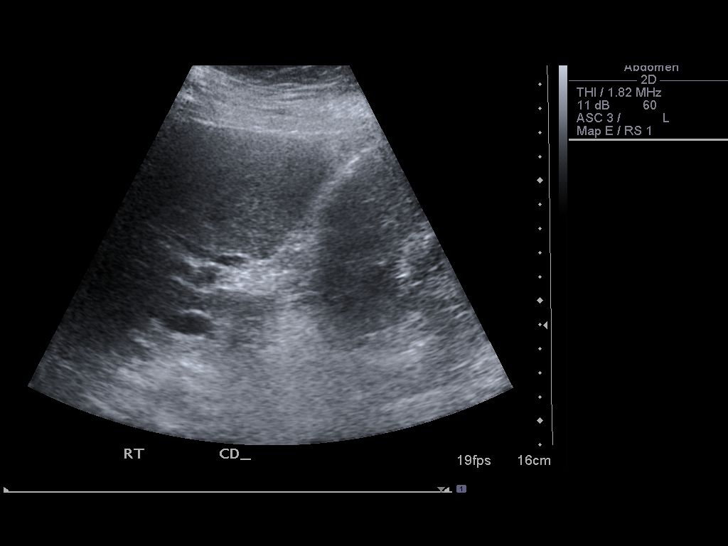
[im 33/49]
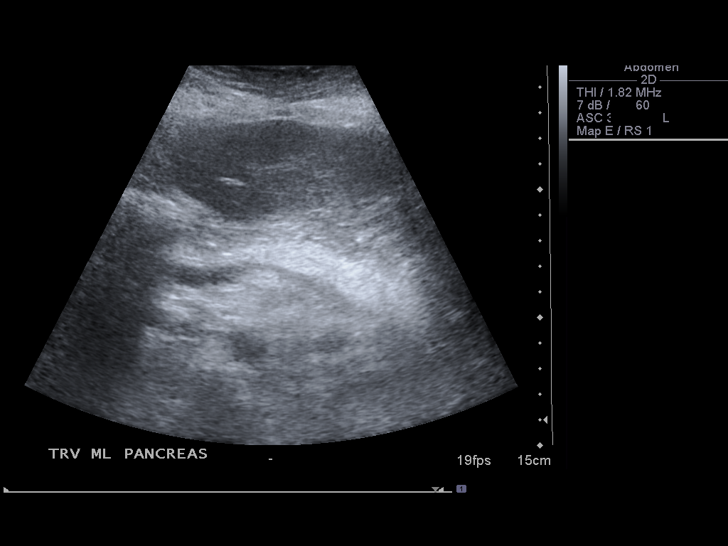
[im 37/49]
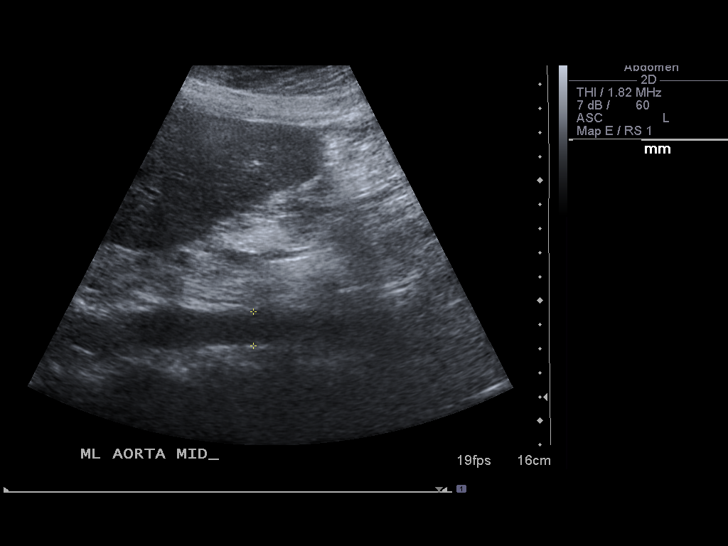
[im 41/49]
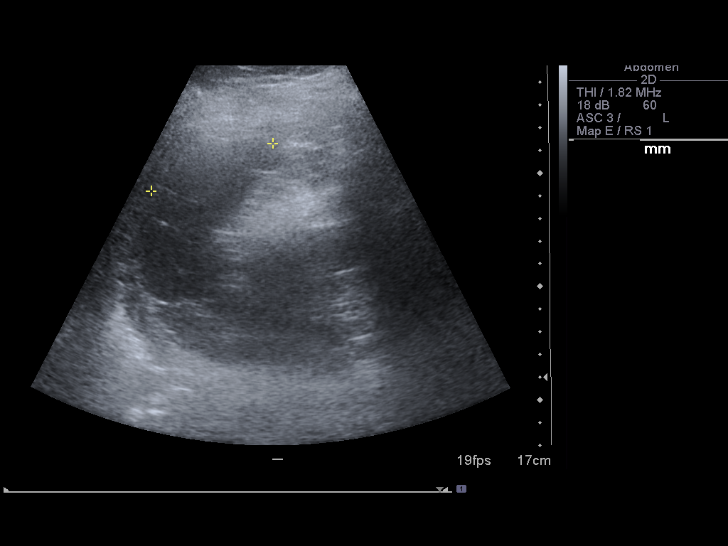
[im 45/49]
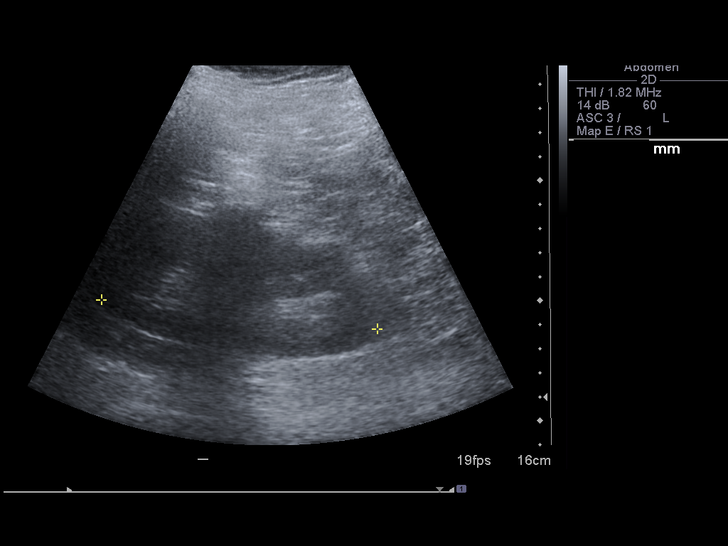
[im 49/49]
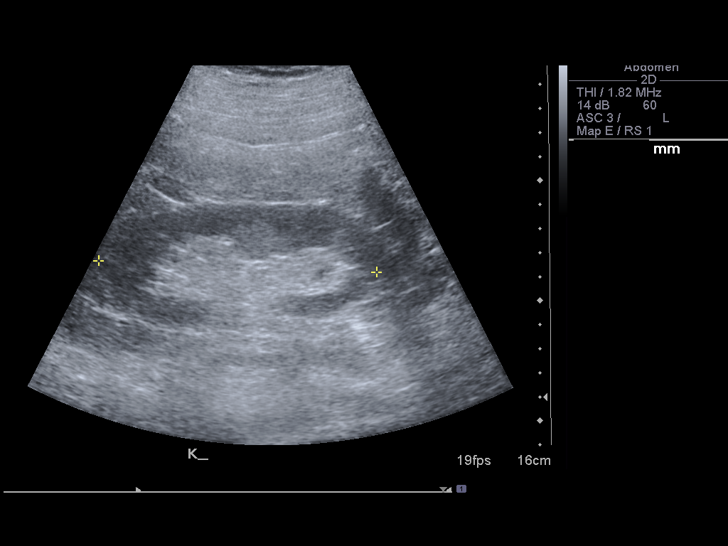

[13 of 25 positions shown; findings below may reference images not displayed]

FINDINGS: Gallbladder: The gallbladder is not visualized compatible with
history of cholecystectomy.

Common Bile Duct:  There is no evidence of intrahepatic or
extrahepatic biliary dilation. The CBD measures 4.7 mm in greatest
diameter.

Liver:  Diffuse increased echogenicity of the liver may represent
mild fatty infiltration.  No focal abnormalities are identified.

IVC:  Appears normal.

Pancreas:  Although the pancreas is difficult to visualize in its
entirety, no focal pancreatic abnormality is identified.

Spleen:  Within normal limits in size and echotexture.

Right kidney:  The right kidney is normal in size and parenchymal
echogenicity.  There is no evidence of solid mass, hydronephrosis
or definite renal calculi.  The right kidney measures 10.3 cm.

Left kidney:  The left kidney is normal in size and parenchymal
echogenicity.  There is no evidence of solid mass, hydronephrosis
or definite renal calculi.   The left kidney measures 11.5 cm.

Abdominal Aorta:  No abdominal aortic aneurysm identified.

There is no evidence of ascites.
IMPRESSION: Question fatty infiltration of the liver.

Cholecystectomy.

No other significant abnormalities identified.

## 2012-07-05 IMAGING — US US ABDOMEN COMPLETE
1 series · 14 of 25 positions shown · non-contrast
Comparison: Ultrasound 10/31/2009.

CLINICAL DATA: Elevated LFTs.

COMPLETE ABDOMINAL ULTRASOUND

[Series 1: us abdomen complete · 0.30mm/px · 14 of 76 slices shown]
[im 1/76]
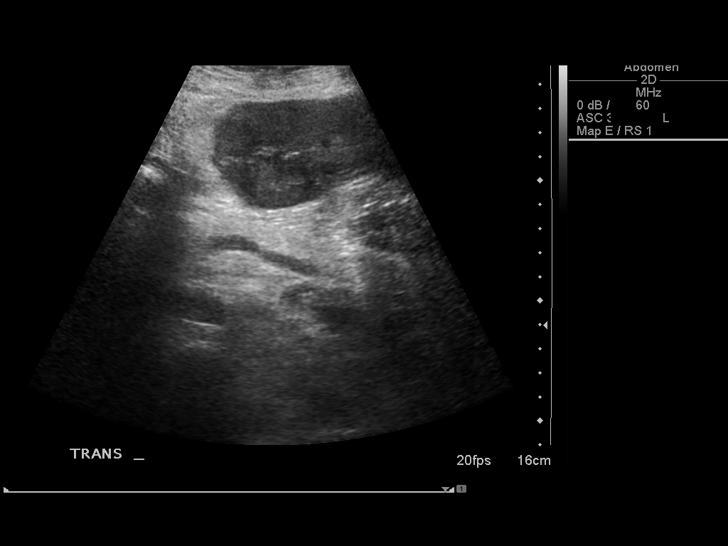
[im 7/76]
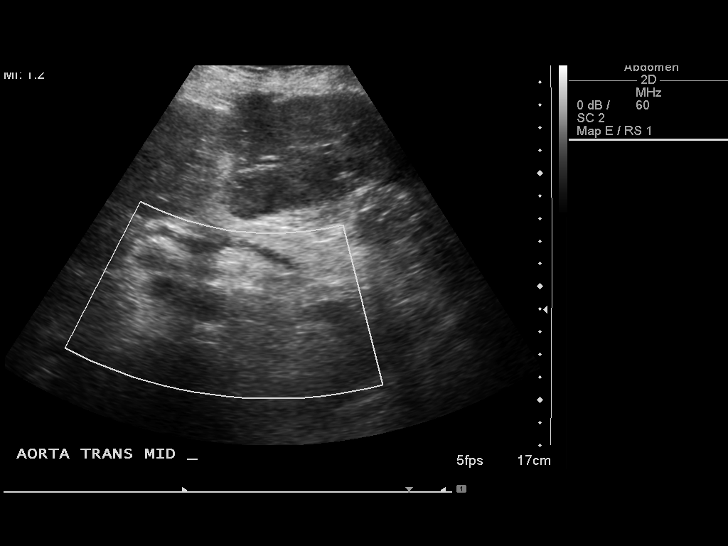
[im 13/76]
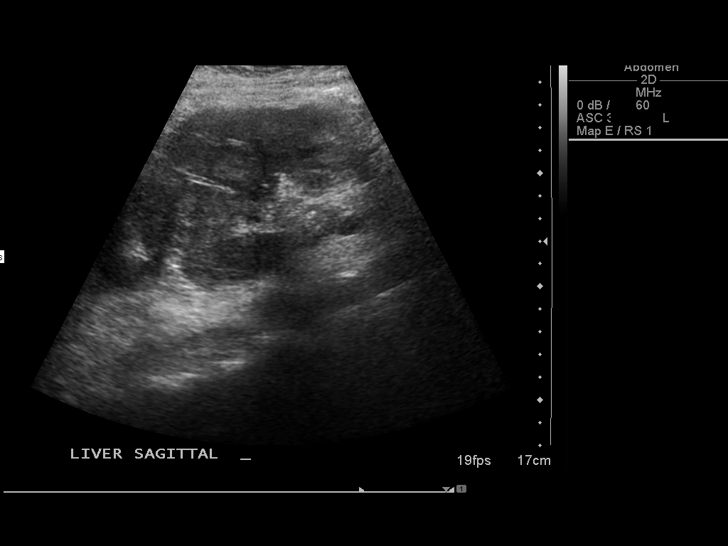
[im 19/76]
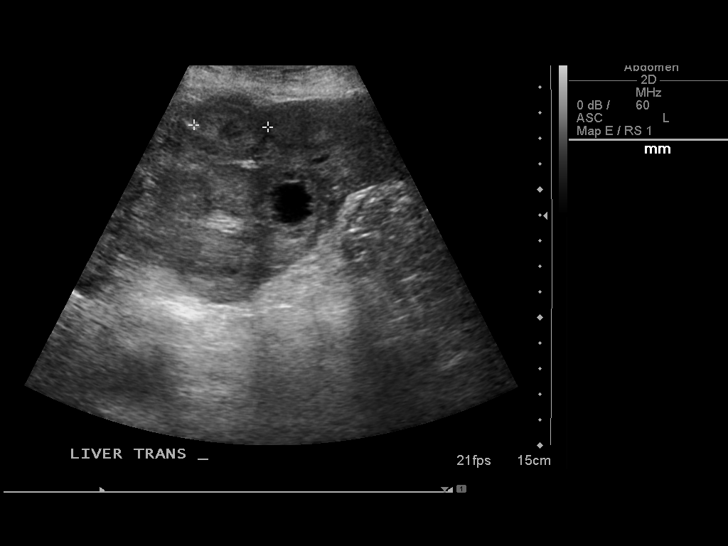
[im 26/76]
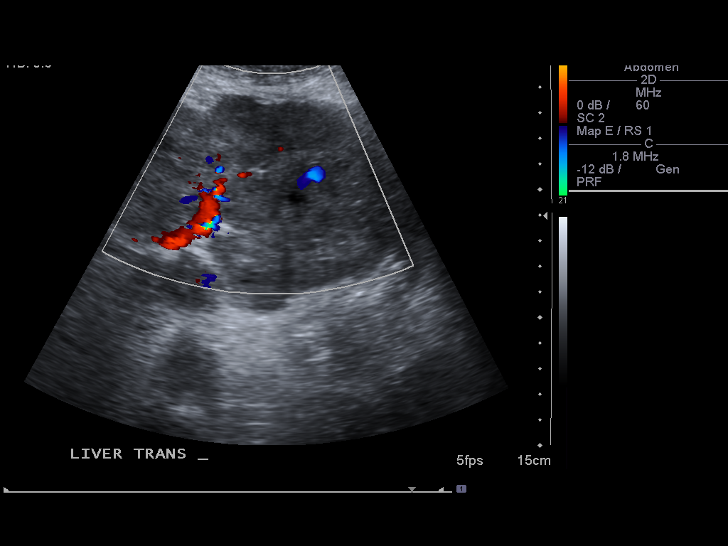
[im 29/76]
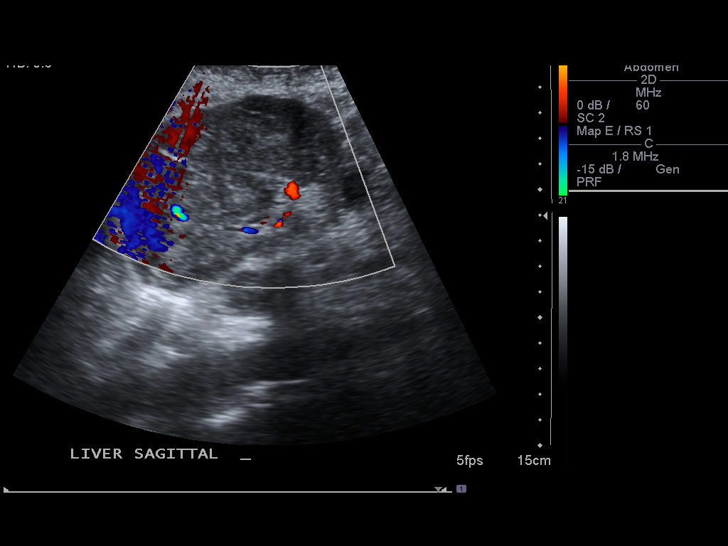
[im 35/76]
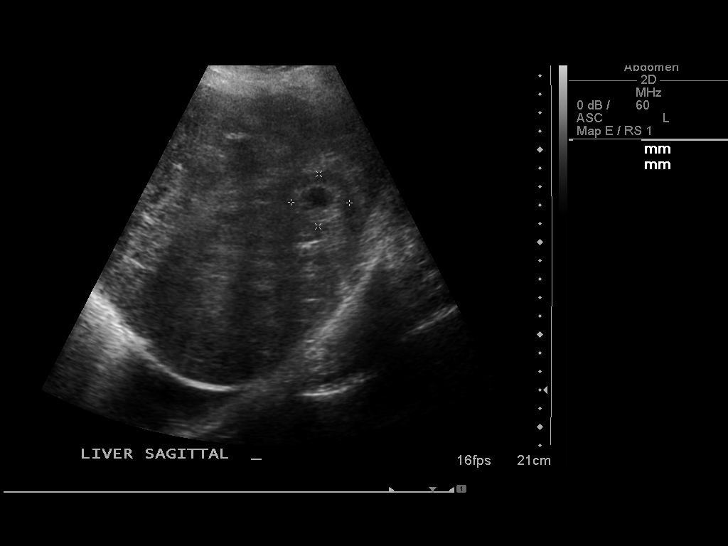
[im 41/76]
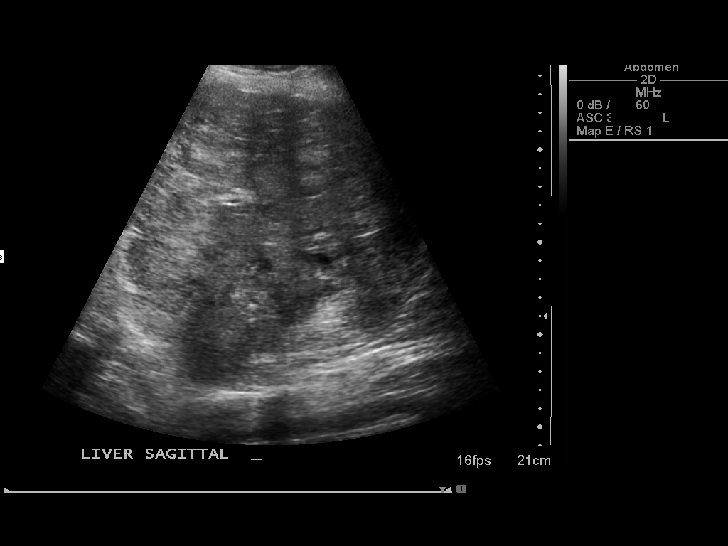
[im 47/76]
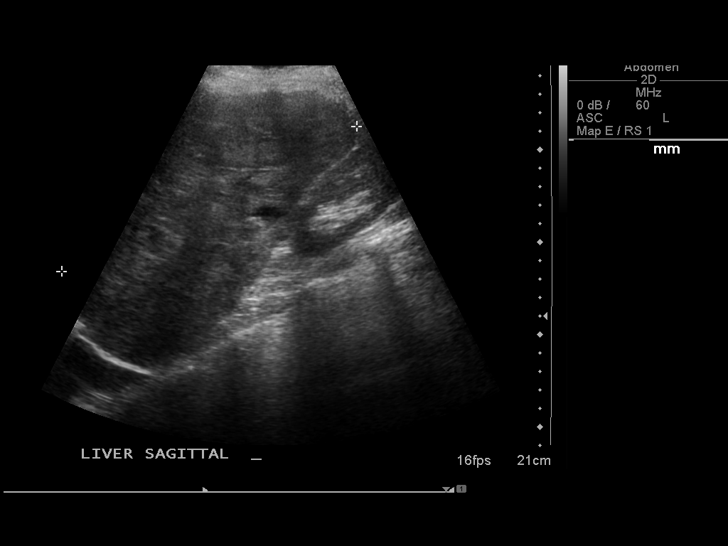
[im 51/76]
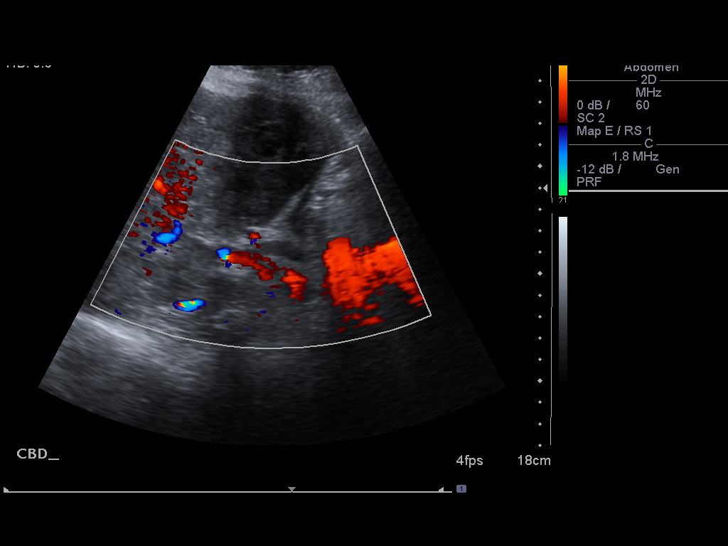
[im 57/76]
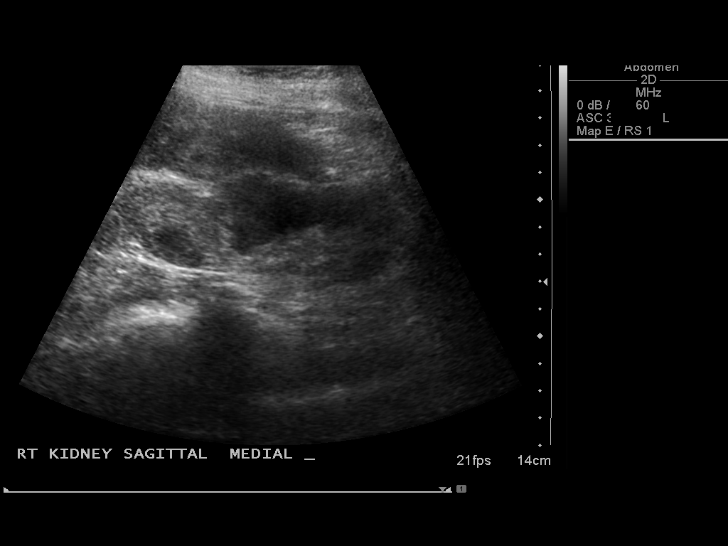
[im 63/76]
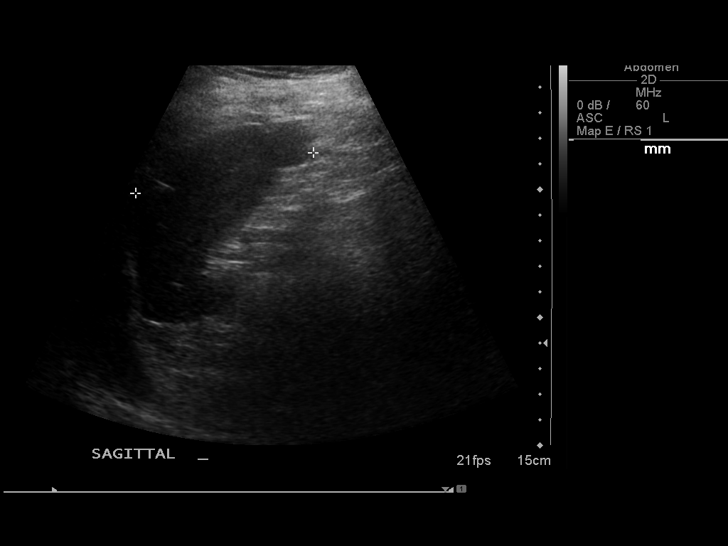
[im 69/76]
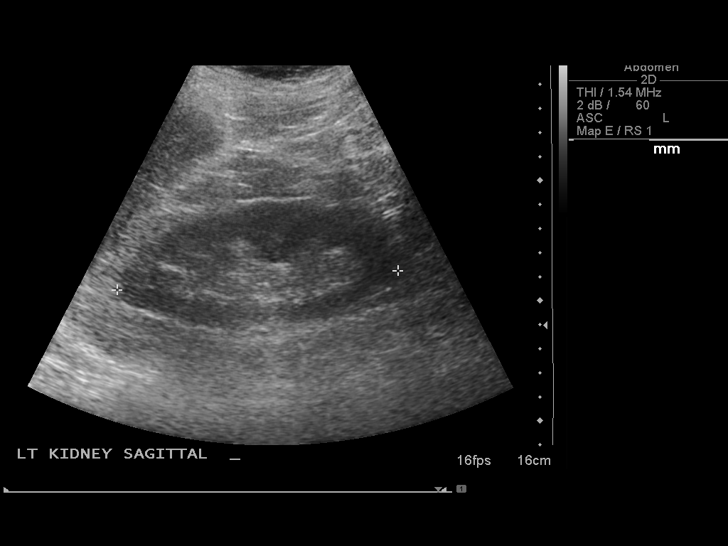
[im 76/76]
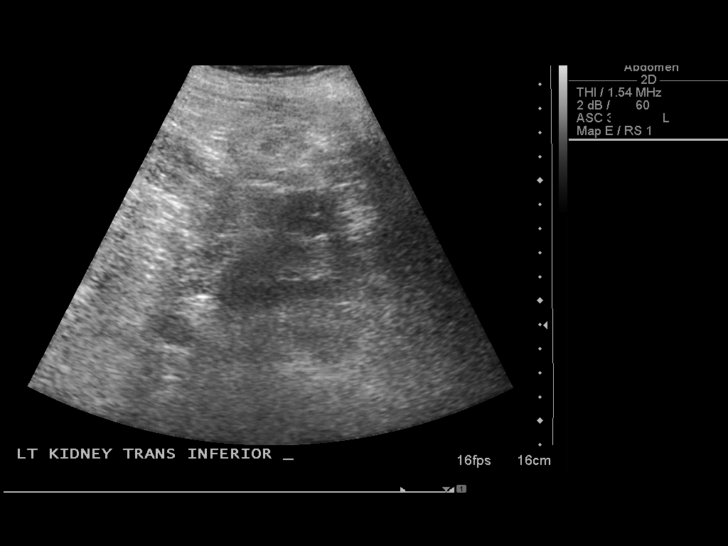

[14 of 25 positions shown; findings below may reference images not displayed]

FINDINGS: Gallbladder:  Surgically absent

Common bile duct:  Within normal limits in caliber measuring
mm.

Liver:  Numerous lesions are noted throughout the liver consistent
with diffuse hepatic metastatic disease, not present on the prior
examination.

IVC:  Normal caliber.

Pancreas:  The body region appears normal.  Head and tail are not
completely visualized.

Spleen:  Normal in size.  Multiple calcified splenic granulomas are
noted.

Right Kidney:  11.6 cm in length. Normal renal cortical thickness
and echogenicity without focal lesions or hydronephrosis.

Left Kidney:  11.7 cm in length. Normal renal cortical thickness
and echogenicity without focal lesions or hydronephrosis.

Abdominal aorta:  Normal caliber.
IMPRESSION: 1.  Numerous lesions throughout the liver consistent with hepatic
metastatic disease.
2.  Status post cholecystectomy.  No common bile duct dilatation.
3.  Bilateral pleural effusions.

## 2012-07-07 IMAGING — CR DG CHEST 1V PORT
1 series · 1 of 1 positions shown · non-contrast
Comparison: 10/31/2009

CLINICAL DATA: PICC placement

PORTABLE CHEST - 1 VIEW

[AP]
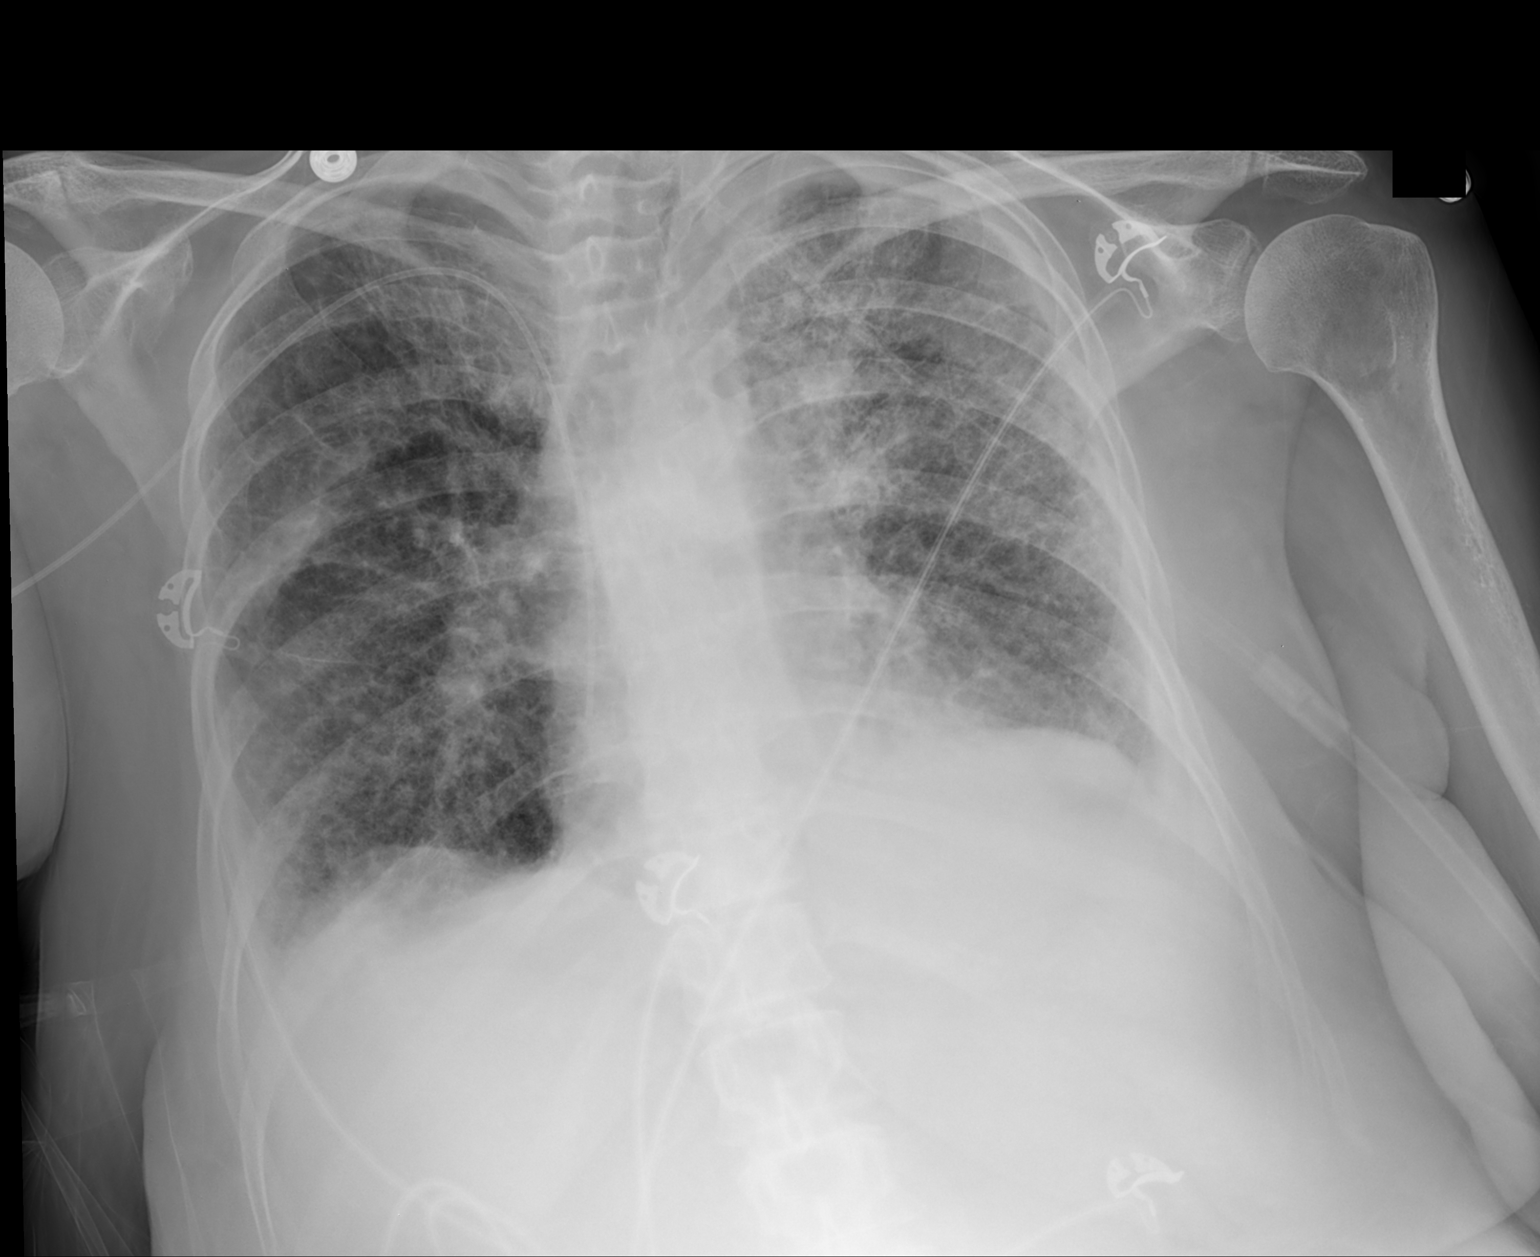

[1 of 1 positions shown; findings below may reference images not displayed]

FINDINGS: Placement of a right upper extremity PICC with the tip at
the cavoatrial junction.  Interstitial prominence is present
throughout both lungs with more focal increased opacity in the left
upper lung and medial left lung base.  Small pleural effusions are
present.  There is no pneumothorax.  The heart, upper abdomen and
osseous structures are unchanged.
IMPRESSION: 1.  Uncomplicated placement of a right upper extremity PICC with
the tip at the cavoatrial junction.
2.  Edema superimposed on interstitial changes with small pleural
effusions.  Left upper lobe and medial left lung base opacities for
which could represent atelectasis or infection.

## 2014-06-25 ENCOUNTER — Telehealth: Payer: Self-pay

## 2014-06-25 NOTE — Telephone Encounter (Signed)
06/25/14 Disc Received from [facility] and filed on shelf.Britt Bottom
